# Patient Record
Sex: Male | Born: 1975 | Race: White | Hispanic: No | Marital: Married | Smoking: Never smoker
Health system: Southern US, Community
[De-identification: ages and names within clinical notes are randomized; demographics above are authoritative.]

## PROBLEM LIST (undated history)

## (undated) DIAGNOSIS — I10 Essential (primary) hypertension: Secondary | ICD-10-CM

## (undated) DIAGNOSIS — G459 Transient cerebral ischemic attack, unspecified: Secondary | ICD-10-CM

## (undated) DIAGNOSIS — N200 Calculus of kidney: Secondary | ICD-10-CM

## (undated) HISTORY — DX: Calculus of kidney: N20.0

## (undated) HISTORY — PX: VASECTOMY: SHX75

## (undated) HISTORY — PX: LITHOTRIPSY: SUR834

## (undated) HISTORY — PX: WISDOM TOOTH EXTRACTION: SHX21

---

## 2007-06-22 ENCOUNTER — Emergency Department (HOSPITAL_COMMUNITY): Admission: EM | Admit: 2007-06-22 | Discharge: 2007-06-22 | Payer: Self-pay | Admitting: Emergency Medicine

## 2012-05-17 ENCOUNTER — Ambulatory Visit (INDEPENDENT_AMBULATORY_CARE_PROVIDER_SITE_OTHER): Payer: BC Managed Care – PPO | Admitting: Emergency Medicine

## 2012-05-17 ENCOUNTER — Other Ambulatory Visit: Payer: Self-pay | Admitting: Urology

## 2012-05-17 ENCOUNTER — Ambulatory Visit: Payer: BC Managed Care – PPO

## 2012-05-17 VITALS — BP 160/109 | HR 72 | Temp 97.5°F | Resp 16 | Ht 69.0 in | Wt 228.0 lb

## 2012-05-17 DIAGNOSIS — M545 Low back pain, unspecified: Secondary | ICD-10-CM

## 2012-05-17 DIAGNOSIS — R110 Nausea: Secondary | ICD-10-CM

## 2012-05-17 DIAGNOSIS — N200 Calculus of kidney: Secondary | ICD-10-CM

## 2012-05-17 LAB — POCT URINALYSIS DIPSTICK
Bilirubin, UA: NEGATIVE
Glucose, UA: NEGATIVE
Spec Grav, UA: 1.025
Urobilinogen, UA: 0.2
pH, UA: 6

## 2012-05-17 LAB — POCT UA - MICROSCOPIC ONLY
Bacteria, U Microscopic: NEGATIVE
Casts, Ur, LPF, POC: NEGATIVE
Epithelial cells, urine per micros: NEGATIVE
Mucus, UA: NEGATIVE

## 2012-05-17 MED ORDER — KETOROLAC TROMETHAMINE 60 MG/2ML IM SOLN
60.0000 mg | Freq: Once | INTRAMUSCULAR | Status: AC
  Administered 2012-05-17: 60 mg via INTRAMUSCULAR

## 2012-05-17 MED ORDER — ONDANSETRON 8 MG PO TBDP: 8.0000 mg | ORAL_TABLET | Freq: Three times a day (TID) | ORAL | Status: DC | PRN

## 2012-05-17 MED ORDER — ONDANSETRON 4 MG PO TBDP
4.0000 mg | ORAL_TABLET | Freq: Once | ORAL | Status: AC
  Administered 2012-05-17: 4 mg via ORAL

## 2012-05-17 NOTE — Patient Instructions (Addendum)
You go today to the Urologist / [facility] at 1:30.    Kidney Stones Kidney stones (ureteral lithiasis) are deposits that form inside your kidneys. The intense pain is caused by the stone moving through the urinary tract. When the stone moves, the ureter goes into spasm around the stone. The stone is usually passed in the urine.  CAUSES   A disorder that makes certain neck glands produce too much parathyroid hormone (primary hyperparathyroidism).  A buildup of uric acid crystals.  Narrowing (stricture) of the ureter.  A kidney obstruction present at birth (congenital obstruction).  Previous surgery on the kidney or ureters.  Numerous kidney infections. SYMPTOMS   Feeling sick to your stomach (nauseous).  Throwing up (vomiting).  Blood in the urine (hematuria).  Pain that usually spreads (radiates) to the groin.  Frequency or urgency of urination. DIAGNOSIS   Taking a history and physical exam.  Blood or urine tests.  Computerized X-ray scan (CT scan).  Occasionally, an examination of the inside of the urinary bladder (cystoscopy) is performed. TREATMENT   Observation.  Increasing your fluid intake.  Surgery may be needed if you have severe pain or persistent obstruction. The size, location, and chemical composition are all important variables that will determine the proper choice of action for you. Talk to your caregiver to better understand your situation so that you will minimize the risk of injury to yourself and your kidney.  HOME CARE INSTRUCTIONS   Drink enough water and fluids to keep your urine clear or pale yellow.  Strain all urine through the provided strainer. Keep all particulate matter and stones for your caregiver to see. The stone causing the pain may be as small as a grain of salt. It is very important to use the strainer each and every time you pass your urine. The collection of your stone will allow your caregiver to analyze it and verify  that a stone has actually passed.  Only take over-the-counter or prescription medicines for pain, discomfort, or fever as directed by your caregiver.  Make a follow-up appointment with your caregiver as directed.  Get follow-up X-rays if required. The absence of pain does not always mean that the stone has passed. It may have only stopped moving. If the urine remains completely obstructed, it can cause loss of kidney function or even complete destruction of the kidney. It is your responsibility to make sure X-rays and follow-ups are completed. Ultrasounds of the kidney can show blockages and the status of the kidney. Ultrasounds are not associated with any radiation and can be performed easily in a matter of minutes. SEEK IMMEDIATE MEDICAL CARE IF:   Pain cannot be controlled with the prescribed medicine.  You have a fever.  The severity or intensity of pain increases over 18 hours and is not relieved by pain medicine.  You develop a new onset of abdominal pain.  You feel faint or pass out. MAKE SURE YOU:   Understand these instructions.  Will watch your condition.  Will get help right away if you are not doing well or get worse. Document Released: 12/27/2004 Document Revised: 03/21/2011 Document Reviewed: 04/24/2009 [facility] Patient Information 2013 [place], [place].

## 2012-05-17 NOTE — Progress Notes (Signed)
  Subjective:    Patient ID: Wayne Edwards, male    DOB: 1975/07/31, 37 y.o.   MRN: [id number]  Back Pain This is a new problem. The current episode started in the past 7 days. The problem occurs 2 to 4 times per day. The problem is unchanged. The pain is present in the lumbar spine. The quality of the pain is described as shooting, stabbing and aching. The pain is at a severity of 8/10. The pain is severe. The pain is the same all the time. Associated symptoms include a fever.  Emesis  Associated symptoms include chills, diarrhea and a fever.   Patient comes in with right lower back pain that happen last Friday. He went out of town to [place] Last week at on the way back while riding on the bus his lower back started hurting him   Review of Systems  Constitutional: Positive for fever and chills.  Gastrointestinal: Positive for nausea, vomiting and diarrhea.  Genitourinary: Positive for testicular pain.  Musculoskeletal: Positive for back pain.       Objective:   Physical Exam HEENT exam is normal there is exquisite tenderness in the right flank. The abdomen is obese there are no masses felt there is mild right midabdominal tenderness. No results found for this or any previous visit. Results for orders placed in visit on 05/17/12  POCT UA - MICROSCOPIC ONLY      Result Value Range   WBC, Ur, HPF, POC neg     RBC, urine, microscopic TNTC     Bacteria, U Microscopic neg     Mucus, UA neg     Epithelial cells, urine per micros neg     Crystals, Ur, HPF, POC neg     Casts, Ur, LPF, POC neg     Yeast, UA neg    POCT URINALYSIS DIPSTICK      Result Value Range   Color, UA brown     Clarity, UA cloudy     Glucose, UA neg     Bilirubin, UA neg     Ketones, UA neg     Spec Grav, UA 1.025     Blood, UA large     pH, UA 6.0     Protein, UA 30     Urobilinogen, UA 0.2     Nitrite, UA neg     Leukocytes, UA Negative     [facility] reading (PRIMARY) by  Dr. Cleta Alberts is a 4 mm calcific  density adjacent to the transverse process of the L3 vertebrae        Assessment & Plan:  Patient with significant hematuria as well as evidence on KUB of the stone we'll contact the urologist regarding next step.

## 2012-05-18 ENCOUNTER — Encounter (HOSPITAL_COMMUNITY): Payer: Self-pay | Admitting: *Deleted

## 2012-05-18 ENCOUNTER — Encounter (HOSPITAL_COMMUNITY): Payer: Self-pay | Admitting: Pharmacy Technician

## 2012-05-18 NOTE — Progress Notes (Signed)
PA approved for zofran ODT 8 mg through 05/18/13. Faxed pharmacy.

## 2012-05-18 NOTE — Progress Notes (Signed)
Pre Litho Phone call Spoke to patient via phone,history obtained,updated.  Bring blue folder,insurance cards,picture ID,designated driver and living will,POA, if desires (to be placed on chart). Reinforced no aspirin(instructions to hold aspirin per your doctor), ibuprofen products 72 hours prior to procedure Pt stated he has toradol 05/17/12 but knows not to take any of the above 72 hrs prior to procedureNo vitamins or herbal medicines 7 days prior to procedure.   Follow laxative instructions provided by urologist (office) and in blue folder. Wear easy on/off clothing and no jewelry except wedding rings and ear rings. Leave all other valuables at home. Verbalizes understanding of instructions

## 2012-05-21 ENCOUNTER — Encounter (HOSPITAL_COMMUNITY): Payer: Self-pay

## 2012-05-21 ENCOUNTER — Encounter (HOSPITAL_COMMUNITY)
Admission: RE | Disposition: A | Discharge status: Home / Self Care | Payer: Self-pay | Source: Ambulatory Visit | Attending: Urology

## 2012-05-21 ENCOUNTER — Ambulatory Visit (HOSPITAL_COMMUNITY): Payer: BC Managed Care – PPO

## 2012-05-21 ENCOUNTER — Ambulatory Visit (HOSPITAL_COMMUNITY)
Admission: RE | Admit: 2012-05-21 | Discharge: 2012-05-21 | Disposition: A | Discharge status: Home / Self Care | Payer: BC Managed Care – PPO | Source: Ambulatory Visit | Attending: Urology | Admitting: Urology

## 2012-05-21 DIAGNOSIS — K219 Gastro-esophageal reflux disease without esophagitis: Secondary | ICD-10-CM | POA: Insufficient documentation

## 2012-05-21 DIAGNOSIS — N201 Calculus of ureter: Secondary | ICD-10-CM | POA: Insufficient documentation

## 2012-05-21 SURGERY — LITHOTRIPSY, ESWL
Anesthesia: LOCAL

## 2012-05-21 MED ORDER — DIPHENHYDRAMINE HCL 25 MG PO CAPS
25.0000 mg | ORAL_CAPSULE | ORAL | Status: AC
  Administered 2012-05-21: 25 mg via ORAL
  Filled 2012-05-21: qty 1

## 2012-05-21 MED ORDER — SODIUM CHLORIDE 0.9 % IV SOLN
INTRAVENOUS | Status: DC
  Administered 2012-05-21: 15:00:00 via INTRAVENOUS

## 2012-05-21 MED ORDER — CIPROFLOXACIN HCL 500 MG PO TABS
500.0000 mg | ORAL_TABLET | ORAL | Status: AC
  Administered 2012-05-21: 500 mg via ORAL
  Filled 2012-05-21: qty 1

## 2012-05-21 MED ORDER — OXYCODONE-ACETAMINOPHEN 5-325 MG PO TABS
ORAL_TABLET | ORAL | Status: AC
  Administered 2012-05-21: 2
  Filled 2012-05-21: qty 2

## 2012-05-21 MED ORDER — OXYCODONE-ACETAMINOPHEN 5-325 MG PO TABS: 1.0000 | ORAL_TABLET | Freq: Once | ORAL | Status: DC

## 2012-05-21 MED ORDER — CIPROFLOXACIN HCL 500 MG PO TABS: 500.0000 mg | ORAL_TABLET | Freq: Two times a day (BID) | ORAL | Status: DC

## 2012-05-21 MED ORDER — OXYCODONE-ACETAMINOPHEN 5-325 MG PO TABS: 1.0000 | ORAL_TABLET | ORAL | Status: DC | PRN

## 2012-05-21 MED ORDER — DIAZEPAM 5 MG PO TABS
10.0000 mg | ORAL_TABLET | ORAL | Status: AC
  Administered 2012-05-21: 10 mg via ORAL
  Filled 2012-05-21: qty 2

## 2012-05-21 NOTE — H&P (Signed)
History of Present Illness         This is a new patient referred for possible right ureteral stone by Dr. Cleta Alberts - [facility]. He developed some right flank pain about a week ago. He then developed diarrhea and emesis. Yesterday he got nausea and pain radiating down into right testicle. Pain intermittent. Earlier this morning in was severe right flank. He went to UC. He was given a shot of toradol and zofran.    He has no gross hematuria or dysuria.   May 17, 2012 KUB showed a possible 4 mm stone over right L3 transverse process. I reviewed the imaging compared it to the CT in 2011 which showed the stone in the right kidney as well as a right UVJ stone he was passing at the time.  Urinalysis May 8 showed too numerous to count red cells but no other abnormalities.   They are also interested in vasectomy. Their kids are 6 mo and 80 yo. Curently using "abstinence". Theyve considered vasectomy for several years. They lost a baby prior to the 16 mo old and had discussed.   Right renal US - mild right hydro and dilation of right ureter. PVR normal.    Past Medical History Problems  1. History of  Esophageal Reflux 530.81  Surgical History Problems  1. History of  Dental Surgery  Current Meds 1. No Reported Medications  Allergies Medication  1. No Known Drug Allergies  Family History Problems  1. Family history of  Family Health Status - Father's Age 65y/o 2. Family history of  Family Health Status - Mother's Age 62y/o 3. Family history of  Family Health Status Number Of Children 1 son  1 daughter 4. Paternal history of  Nephrolithiasis  Social History Problems    Being A Social Drinker   Caffeine Use 2-3 a day   Marital History - Currently Married   Never A Smoker   Occupation: Runner, broadcasting/film/video Denied    History of  Tobacco Use  Review of Systems Genitourinary, constitutional, skin, eye, otolaryngeal, hematologic/lymphatic, cardiovascular, pulmonary, endocrine, musculoskeletal,  gastrointestinal, neurological and psychiatric system(s) were reviewed and pertinent findings if present are noted.    Vitals Vital Signs [Data Includes: Last 1 Day]  08May2014 01:54PM  Blood Pressure: 106 / 69 Temperature: 97.4 F Heart Rate: 73 08May2014 01:51PM  BMI Calculated: 32.64 BSA Calculated: 2.21 Height: 5 ft 10 in Weight: 228 lb   Physical Exam Constitutional: Well nourished and well developed . No acute distress.  ENT:. The ears and nose are normal in appearance.  Neck: The appearance of the neck is normal and no neck mass is present.  Pulmonary: No respiratory distress and normal respiratory rhythm and effort.  Cardiovascular: Heart rate and rhythm are normal . No peripheral edema.  Abdomen: The abdomen is soft and nontender. No masses are palpated. No CVA tenderness. No hernias are palpable. No hepatosplenomegaly noted.  Lymphatics: The femoral and inguinal nodes are not enlarged or tender.  Skin: Normal skin turgor, no visible rash and no visible skin lesions.  Neuro/Psych:. Mood and affect are appropriate.    Results/Data Urine [Data Includes: Last 1 Day]   08May2014  COLOR YELLOW   APPEARANCE CLOUDY   SPECIFIC GRAVITY 1.015   pH 7.0   GLUCOSE NEG mg/dL  BILIRUBIN NEG   KETONE NEG mg/dL  BLOOD LARGE   PROTEIN NEG mg/dL  UROBILINOGEN 0.2 mg/dL  NITRITE NEG   LEUKOCYTE ESTERASE NEG   SQUAMOUS EPITHELIAL/HPF NONE SEEN  WBC NONE SEEN WBC/hpf  RBC 21-50 RBC/hpf  BACTERIA NONE SEEN   CRYSTALS NONE SEEN   CASTS NONE SEEN    Assessment Assessed  1. Ureteral Stone 592.1  Plan Health Maintenance (V70.0)  1. UA With REFLEX  Done: 08May2014 01:41PM Ureteral Stone (592.1)  2. Hydrocodone-Acetaminophen 5-325 MG Oral Tablet; TAKE 1 TO 2 TABLETS EVERY 4 TO 6  HOURS AS NEEDED FOR PAIN; Therapy: 08May2014 to (Evaluate:07Jun2014); Last  Rx:08May2014 3. Promethazine HCl 12.5 MG Oral Tablet; TAKE 1 TABLET EVERY 4 TO 6 HOURS AS NEEDED  FOR NAUSEA; Therapy:  08May2014 to (Evaluate:22May2014)  Requested for: 08May2014; Last  Rx:08May2014; Edited 4. Tamsulosin HCl 0.4 MG Oral Capsule; TAKE 1 CAPSULE Daily; Therapy: 08May2014 to  (Evaluate:07Jun2014)  Requested for: 08May2014; Last Rx:08May2014; Edited 5. Follow-up Schedule Surgery Office  Follow-up  Requested for: 08May2014 6. RENAL U/S RIGHT  Done: 08May2014 12:00AM  Discussion/Summary       I discussed with the patient and his wife the nature risks benefits of continued stone passage with off label alpha-blocker use, endoscopic management and the role of ureteral stenting, extracorporeal shockwave lithotripsy. He wants to limit his absence from work and does not warrant invasive therapy. He is interested in shockwave but would like to try to pass the stone for a few days.  Renal ultrasound today shows hydro.   We will consider vasectomy further in the future.    Signatures Electronically signed by : Jerilee Field, M.D.; May 17 2012  3:31PM

## 2012-05-21 NOTE — Progress Notes (Signed)
Post lithotripsy recovery in short stay. Returns via w/c and right flank has softball sized area of pink

## 2012-07-10 ENCOUNTER — Ambulatory Visit: Payer: BC Managed Care – PPO

## 2013-07-25 ENCOUNTER — Ambulatory Visit (INDEPENDENT_AMBULATORY_CARE_PROVIDER_SITE_OTHER): Payer: BC Managed Care – PPO | Admitting: Family Medicine

## 2013-07-25 VITALS — BP 124/74 | HR 77 | Temp 98.0°F | Resp 16 | Ht 69.0 in | Wt 172.0 lb

## 2013-07-25 DIAGNOSIS — H60392 Other infective otitis externa, left ear: Secondary | ICD-10-CM

## 2013-07-25 DIAGNOSIS — H60399 Other infective otitis externa, unspecified ear: Secondary | ICD-10-CM

## 2013-07-25 MED ORDER — OFLOXACIN 0.3 % OT SOLN: 10.0000 [drp] | Freq: Every day | OTIC | Status: DC

## 2013-07-25 MED ORDER — OFLOXACIN 0.3 % OT SOLN: 5.0000 [drp] | Freq: Every day | OTIC | Status: DC

## 2013-07-25 NOTE — Progress Notes (Signed)
   Subjective:    Patient ID: Wayne Edwards, male    DOB: 09-Mar-1975, 38 y.o.   MRN: [id number]  HPI Patient presents today with 3-4 days of left ear pain and drainage (yellow-clear). Took a left over hydrocodone which helped his pain. Took ibuprofen which did not provide much relief. Applied ear drying drops containing alcohol which was very painful. [place] days [place] to onset of pain[place] [employer] [place]  Son with cold last week.  Review of Systems Subjective fever 4-5 days ago, sore throat off and on, no runny nose, mild headache, occasional cough- nonproductive.    Objective:   Physical Exam  Vitals reviewed. Constitutional: He is oriented to person, place, and time. He appears well-developed and well-nourished.  HENT:  Head: Normocephalic and atraumatic.  Right Ear: Tympanic membrane, external ear and ear canal normal.  Left Ear: There is drainage, swelling and tenderness. No mastoid tenderness. Tympanic membrane is erythematous.  Eyes: Conjunctivae are normal.  Neck: Normal range of motion. Neck supple.  Cardiovascular: Normal rate, regular rhythm and normal heart sounds.   Pulmonary/Chest: Effort normal and breath sounds normal.  Musculoskeletal: Normal range of motion.  Neurological: He is alert and oriented to person, place, and time.  Skin: Skin is warm and dry.  Psychiatric: He has a normal mood and affect. His behavior is normal. Judgment and thought content normal.      Assessment & Plan:  1. Otitis, externa, infective, left - ofloxacin (FLOXIN) 0.3 % otic solution; Place 10 drops into the left ear daily. For 7 days  Dispense: 10 mL; Refill: 0 -RTC if no improvement in symptoms in 3-5 days or if worsening pain -ibuprofen/acetaminophen prn  Emi Belfasteborah B. Maie Kesinger, FNP-BC  [facility], [facility]  07/25/2013 9:30 PM

## 2013-07-25 NOTE — Patient Instructions (Signed)
Otitis Externa Otitis externa is a bacterial or fungal infection of the outer ear canal. This is the area from the eardrum to the outside of the ear. Otitis externa is sometimes called "swimmer's ear." CAUSES  Possible causes of infection include:  Swimming in dirty water.  Moisture remaining in the ear after swimming or bathing.  Mild injury (trauma) to the ear.  Objects stuck in the ear (foreign body).  Cuts or scrapes (abrasions) on the outside of the ear. SYMPTOMS  The first symptom of infection is often itching in the ear canal. Later signs and symptoms may include swelling and redness of the ear canal, ear pain, and yellowish-white fluid (pus) coming from the ear. The ear pain may be worse when pulling on the earlobe. DIAGNOSIS  Your caregiver will perform a physical exam. A sample of fluid may be taken from the ear and examined for bacteria or fungi. TREATMENT  Antibiotic ear drops are often given for 10 to 14 days. Treatment may also include pain medicine or corticosteroids to reduce itching and swelling. PREVENTION   Keep your ear dry. Use the corner of a towel to absorb water out of the ear canal after swimming or bathing.  Avoid scratching or putting objects inside your ear. This can damage the ear canal or remove the protective wax that lines the canal. This makes it easier for bacteria and fungi to grow.  Avoid swimming in lakes, polluted water, or poorly chlorinated pools.  You may use ear drops made of rubbing alcohol and vinegar after swimming. Combine equal parts of white vinegar and alcohol in a bottle. Put 3 or 4 drops into each ear after swimming. HOME CARE INSTRUCTIONS   Apply antibiotic ear drops to the ear canal as prescribed by your caregiver.  Only take over-the-counter or prescription medicines for pain, discomfort, or fever as directed by your caregiver.  If you have diabetes, follow any additional treatment instructions from your caregiver.  Keep all  follow-up appointments as directed by your caregiver. SEEK MEDICAL CARE IF:   You have a fever.  Your ear is still red, swollen, painful, or draining pus after 3 days.  Your redness, swelling, or pain gets worse.  You have a severe headache.  You have redness, swelling, pain, or tenderness in the area behind your ear. MAKE SURE YOU:   Understand these instructions.  Will watch your condition.  Will get help right away if you are not doing well or get worse. Document Released: 12/27/2004 Document Revised: 03/21/2011 Document Reviewed: 01/13/2011 [employer] Patient Information 2015 [employer]. This information is not intended to replace advice given to you by your health care provider. Make sure you discuss any questions you have with your health care provider.  

## 2013-07-26 ENCOUNTER — Ambulatory Visit: Payer: BC Managed Care – PPO | Admitting: Family Medicine

## 2013-08-05 ENCOUNTER — Telehealth: Payer: Self-pay

## 2013-08-05 NOTE — Telephone Encounter (Signed)
Pt would like to talk with someone about getting a referral to an ent office   Best number [phone]

## 2013-08-06 NOTE — Telephone Encounter (Signed)
lmom for pt to cb

## 2013-08-07 NOTE — Telephone Encounter (Signed)
Spoke to pt: Would like a referral to ENT, eardrops for ear drainage, hearing loss, infection.  Ear not 100% it is slowly improving, but still having drainage.  Suggested pt RTC for eval since he is still having issues.  Transferred to billing to schedule appt.

## 2013-08-08 ENCOUNTER — Ambulatory Visit (INDEPENDENT_AMBULATORY_CARE_PROVIDER_SITE_OTHER): Payer: BC Managed Care – PPO | Admitting: Family Medicine

## 2013-08-08 VITALS — BP 110/74 | HR 75 | Temp 98.9°F | Resp 18 | Ht 69.5 in | Wt 173.0 lb

## 2013-08-08 DIAGNOSIS — H66009 Acute suppurative otitis media without spontaneous rupture of ear drum, unspecified ear: Secondary | ICD-10-CM

## 2013-08-08 DIAGNOSIS — H66002 Acute suppurative otitis media without spontaneous rupture of ear drum, left ear: Secondary | ICD-10-CM

## 2013-08-08 MED ORDER — AMOXICILLIN-POT CLAVULANATE 875-125 MG PO TABS: 1.0000 | ORAL_TABLET | Freq: Two times a day (BID) | ORAL | Status: DC

## 2013-08-08 NOTE — Progress Notes (Signed)
Subjective:  This chart was scribed for Wayne SorensonEva Kialee Kham, MD by Carl Bestelina Holson, Medical Scribe. This patient was seen in Room 13 and the patient's care was started at 12:07 PM.   Patient ID: Wayne Edwards, male    DOB: March 26, 1975, 38 y.o.   MRN: [id number] Chief Complaint  Patient presents with  . Follow-up    recheck left ear     HPI HPI Comments: Wayne Nasutidam Wolgamott is a 38 y.o. male who presents to the [facility] for a follow-up of otitis externa in his left ear.  He is not experiencing any pain and the drainage from his left ear has decreased.  He feels as though something is still in his ear and there has been some slight drainage.  He states that he used the drops as prescribed.  He denies sinus pressure, fever, and chills as associated symptoms.  He does endorse some hearing loss.  He has not had a history of hearing loss or ear problems.  He does not have any allergies to antibiotics.  He did experience ear infections as a child.    He was seen two weeks ago and diagnosed with otitis externa.  He was placed on Floxin drops.  Past Medical History  Diagnosis Date  . Kidney stones     age 38  and 2011   Past Surgical History  Procedure Laterality Date  . Wisdom tooth extraction    . Vasectomy    . Lithotripsy     History reviewed. No pertinent family history. History   Social History  . Marital Status: Married    Spouse Name: N/A    Number of Children: N/A  . Years of Education: N/A   Occupational History  . Not on file.   Social History Main Topics  . Smoking status: Never Smoker   . Smokeless tobacco: Never Used  . Alcohol Use: Yes     Comment: rarely  . Drug Use: No  . Sexual Activity: Yes   Other Topics Concern  . Not on file   Social History Narrative  . No narrative on file   Allergies  Allergen Reactions  . Yellow Jacket Venom [Bee Venom] Anaphylaxis    Review of Systems  Constitutional: Negative for fever, chills and diaphoresis.  HENT:  Positive for ear discharge and hearing loss. Negative for ear pain and sinus pressure.   Neurological: Negative for headaches.      BP 110/74  Pulse 75  Temp(Src) 98.9 F (37.2 C) (Oral)  Resp 18  Ht 5' 9.5" (1.765 m)  Wt 173 lb (78.472 kg)  BMI 25.19 kg/m2  SpO2 99%  Objective:  Physical Exam  Nursing note and vitals reviewed. Constitutional: He is oriented to person, place, and time. He appears well-developed and well-nourished.  HENT:  Head: Normocephalic and atraumatic.  Right Ear: A middle ear effusion is present.  Left Ear: Ear canal normal. Tympanic membrane is injected, erythematous and retracted. A middle ear effusion is present.  Loss of hearing in left ear to soft sound.  Eyes: EOM are normal.  Neck: Normal range of motion.  Cardiovascular: Normal rate.   Pulmonary/Chest: Effort normal.  Musculoskeletal: Normal range of motion.  Neurological: He is alert and oriented to person, place, and time.  Skin: Skin is warm and dry.  Psychiatric: He has a normal mood and affect. His behavior is normal.     Assessment & Plan:   Will prescribe the patient antibiotics.  Advised the  patient to monitor his symptoms for the next two weeks and if they do not resolve, will refer patient to an ENT specialist. Acute suppurative otitis media of left ear without spontaneous rupture of tympanic membrane, recurrence not specified  Meds ordered this encounter  Medications  . amoxicillin-clavulanate (AUGMENTIN) 875-125 MG per tablet    Sig: Take 1 tablet by mouth 2 (two) times daily.    Dispense:  28 tablet    Refill:  0    I personally performed the services described in this documentation, which was scribed in my presence. The recorded information has been reviewed and considered, and addended by me as needed.  Wayne Sorenson, MD MPH

## 2013-08-08 NOTE — Patient Instructions (Signed)
Otitis Media With Effusion Otitis media with effusion is the presence of fluid in the middle ear. This is a common problem in children, which often follows ear infections. It may be present for weeks or longer after the infection. Unlike an acute ear infection, otitis media with effusion refers only to fluid behind the ear drum and not infection. Children with repeated ear and sinus infections and allergy problems are the most likely to get otitis media with effusion. CAUSES  The most frequent cause of the fluid buildup is dysfunction of the eustachian tubes. These are the tubes that drain fluid in the ears to the back of the nose (nasopharynx). SYMPTOMS   The main symptom of this condition is hearing loss. As a result, you or your child may:  Listen to the TV at a loud volume.  Not respond to questions.  Ask "what" often when spoken to.  Mistake or confuse one sound or word for another.  There may be a sensation of fullness or pressure but usually not pain. DIAGNOSIS   Your health care provider will diagnose this condition by examining you or your child's ears.  Your health care provider may test the pressure in you or your child's ear with a tympanometer.  A hearing test may be conducted if the problem persists. TREATMENT   Treatment depends on the duration and the effects of the effusion.  Antibiotics, decongestants, nose drops, and cortisone-type drugs (tablets or nasal spray) may not be helpful.  Children with persistent ear effusions may have delayed language or behavioral problems. Children at risk for developmental delays in hearing, learning, and speech may require referral to a specialist earlier than children not at risk.  You or your child's health care provider may suggest a referral to an ear, nose, and throat surgeon for treatment. The following may help restore normal hearing:  Drainage of fluid.  Placement of ear tubes (tympanostomy tubes).  Removal of adenoids  (adenoidectomy). HOME CARE INSTRUCTIONS   Avoid secondhand smoke.  Infants who are breastfed are less likely to have this condition.  Avoid feeding infants while they are lying flat.  Avoid known environmental allergens.  Avoid people who are sick. SEEK MEDICAL CARE IF:   Hearing is not better in 3 months.  Hearing is worse.  Ear pain.  Drainage from the ear.  Dizziness. MAKE SURE YOU:   Understand these instructions.  Will watch your condition.  Will get help right away if you are not doing well or get worse. Document Released: 02/04/2004 Document Revised: 05/13/2013 Document Reviewed: 07/24/2012 [facility] Patient Information 2015 [place], [place]. This information is not intended to replace advice given to you by your health care provider. Make sure you discuss any questions you have with your health care provider.  Otitis Media Otitis media is redness, soreness, and inflammation of the middle ear. Otitis media may be caused by allergies or, most commonly, by infection. Often it occurs as a complication of the common cold. SIGNS AND SYMPTOMS Symptoms of otitis media may include:  Earache.  Fever.  Ringing in your ear.  Headache.  Leakage of fluid from the ear. DIAGNOSIS To diagnose otitis media, your health care provider will examine your ear with an otoscope. This is an instrument that allows your health care provider to see into your ear in order to examine your eardrum. Your health care provider also will ask you questions about your symptoms. TREATMENT  Typically, otitis media resolves on its own within 3-5 days. Your  health care provider may prescribe medicine to ease your symptoms of pain. If otitis media does not resolve within 5 days or is recurrent, your health care provider may prescribe antibiotic medicines if he or she suspects that a bacterial infection is the cause. °HOME CARE INSTRUCTIONS  °· If you were prescribed an antibiotic medicine, finish it all  even if you start to feel better. °· Take medicines only as directed by your health care provider. °· Keep all follow-up visits as directed by your health care provider. °SEEK MEDICAL CARE IF: °· You have otitis media only in one ear, or bleeding from your nose, or both. °· You notice a lump on your neck. °· You are not getting better in 3-5 days. °· You feel worse instead of better. °SEEK IMMEDIATE MEDICAL CARE IF:  °· You have pain that is not controlled with medicine. °· You have swelling, redness, or pain around your ear or stiffness in your neck. °· You notice that part of your face is paralyzed. °· You notice that the bone behind your ear (mastoid) is tender when you touch it. °MAKE SURE YOU:  °· Understand these instructions. °· Will watch your condition. °· Will get help right away if you are not doing well or get worse. °Document Released: 10/02/2003 Document Revised: 05/13/2013 Document Reviewed: 07/24/2012 °ExitCare® Patient Information ©2015 [employer] LLC. This information is not intended to replace advice given to you by your health care provider. Make sure you discuss any questions you have with your health care provider. ° °

## 2014-04-22 IMAGING — CR DG ABDOMEN 1V
1 series · 1 of 1 positions shown · non-contrast
Comparison: 05/17/2012.

CLINICAL DATA: Lithotripsy.  Right-sided stone.

ABDOMEN - 1 VIEW

[t abdomen supine]
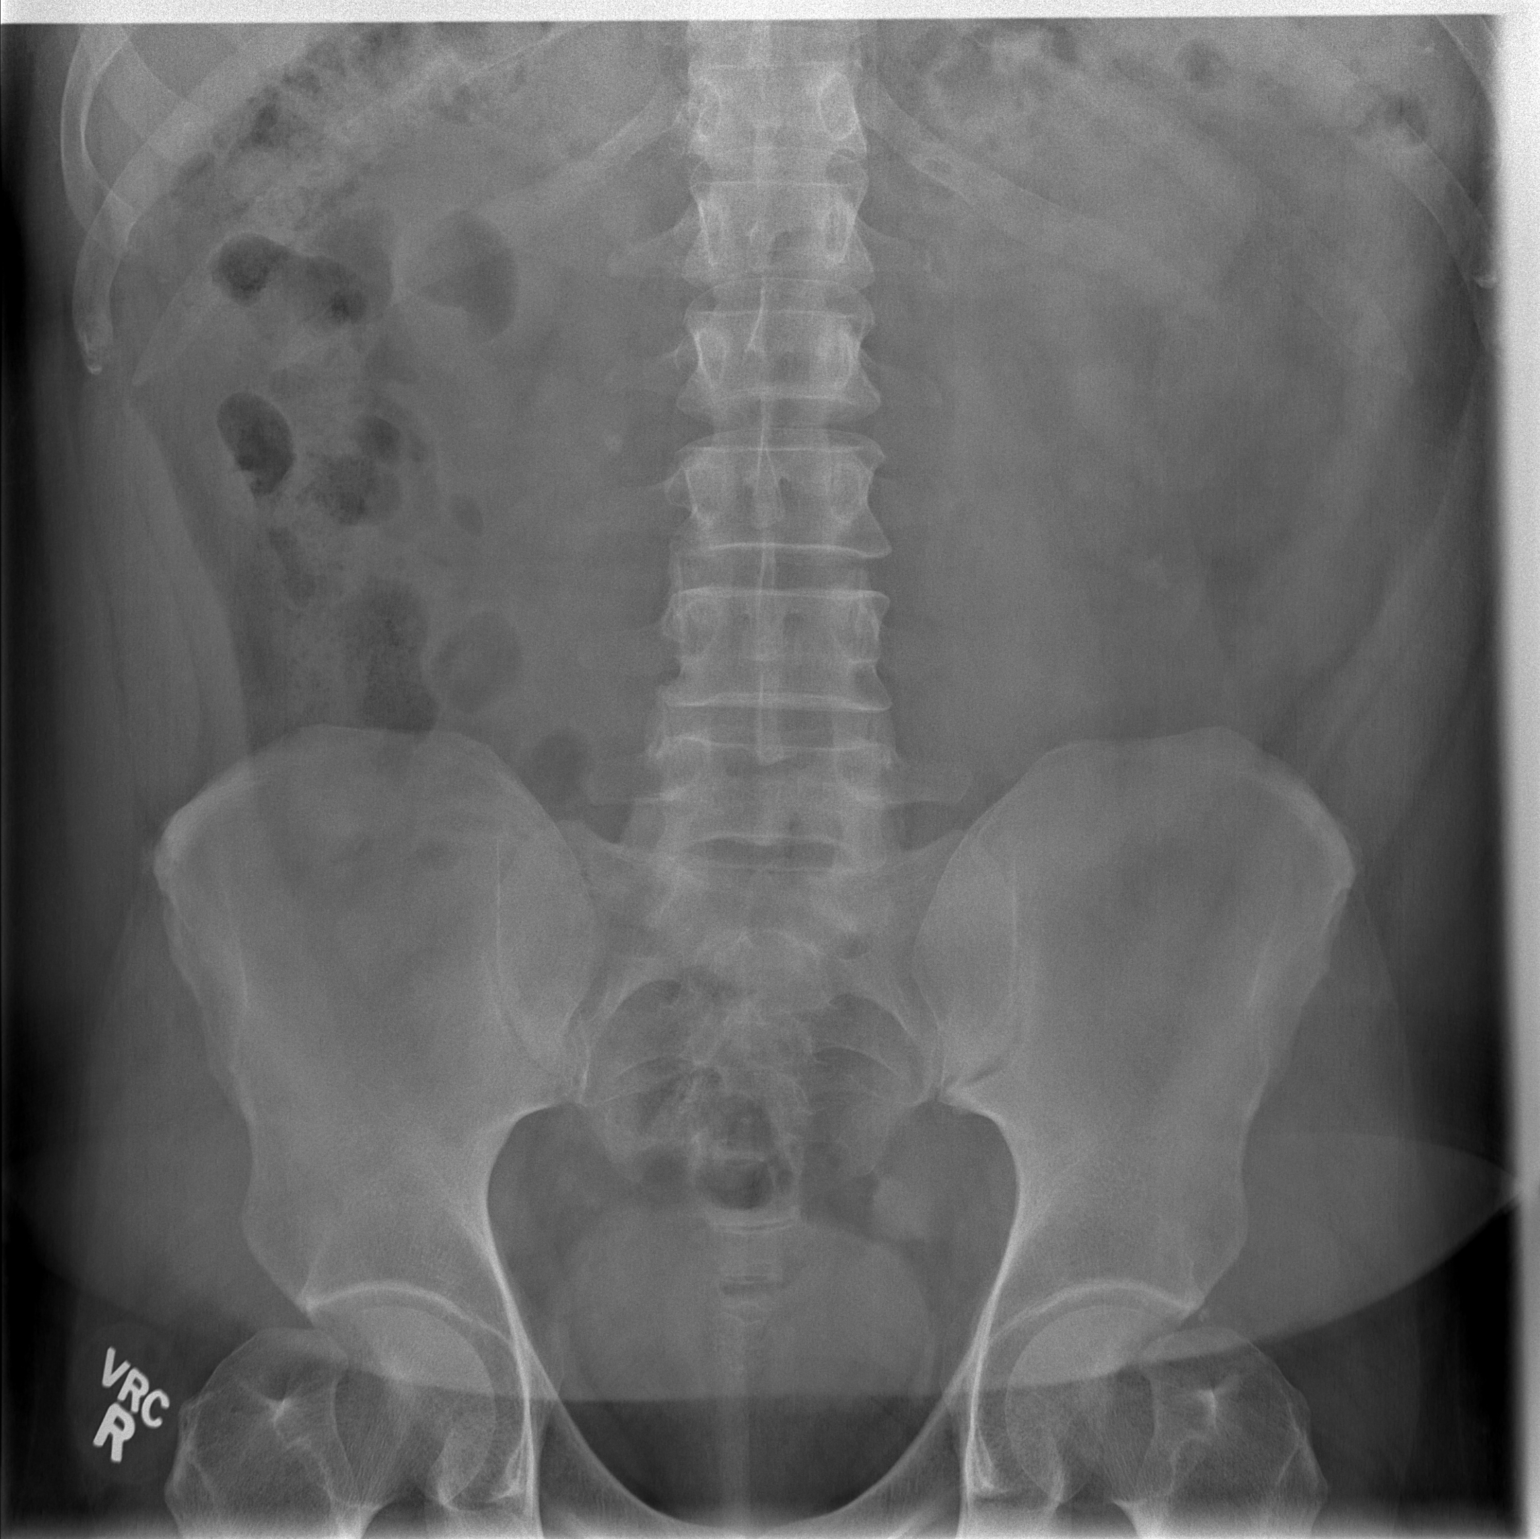

[1 of 1 positions shown; findings below may reference images not displayed]

FINDINGS: 5 mm stone overlying the medial right abdomen persists
without substantial interval change in the appearance or position.
No other unexpected abdominopelvic calcification.  Visualized bony
structures are unremarkable.
IMPRESSION: Persistence of the 5 mm stone in the medial right abdomen.

## 2017-05-04 ENCOUNTER — Encounter: Payer: Self-pay | Admitting: Family Medicine

## 2017-05-04 ENCOUNTER — Ambulatory Visit (INDEPENDENT_AMBULATORY_CARE_PROVIDER_SITE_OTHER): Payer: BC Managed Care – PPO

## 2017-05-04 ENCOUNTER — Ambulatory Visit: Payer: BC Managed Care – PPO | Admitting: Family Medicine

## 2017-05-04 ENCOUNTER — Other Ambulatory Visit: Payer: Self-pay

## 2017-05-04 VITALS — BP 102/84 | HR 85 | Temp 97.6°F | Ht 69.5 in | Wt 204.0 lb

## 2017-05-04 DIAGNOSIS — M79672 Pain in left foot: Secondary | ICD-10-CM

## 2017-05-04 DIAGNOSIS — Z23 Encounter for immunization: Secondary | ICD-10-CM

## 2017-05-04 DIAGNOSIS — Z125 Encounter for screening for malignant neoplasm of prostate: Secondary | ICD-10-CM | POA: Diagnosis not present

## 2017-05-04 DIAGNOSIS — Z0000 Encounter for general adult medical examination without abnormal findings: Secondary | ICD-10-CM | POA: Diagnosis not present

## 2017-05-04 DIAGNOSIS — Z13220 Encounter for screening for lipoid disorders: Secondary | ICD-10-CM | POA: Diagnosis not present

## 2017-05-04 DIAGNOSIS — Z131 Encounter for screening for diabetes mellitus: Secondary | ICD-10-CM

## 2017-05-04 NOTE — Progress Notes (Signed)
4/25/20199:35 AM  Wayne Edwards 04-05-1975, 42 y.o. male [id number]  Chief Complaint  Patient presents with  . Establish Care    having some aches and pains    HPI:   Patient is a 42 y.o. male who presents today for CPE  Has not seen a doctor in years Married, had HIV testing prior to getting married, declines at this time Last tetanus > 10 years He had lost over 75 lbs, mostly with calorie counting and exercise, has regained about 25 lbs back as he has become a bit more lax. He has been stable at this weight for about a year He wears glasses, sees optho yearly Sees dentist regularly, currently in process of getting root canal Has 2 kids, 11yo and 5yo He is a 2nd grade teacher He is having left heel pain, chronic, worsening, no trauma Otherwise feeling well Does not smoke, use illicit drugs. Drinks etoh socially He denies fhx CAD, DM2, prostate or colon cancer He reports grandmother with CHF and recurrent strokes  Depression screen [facility] 2/9 05/04/2017  Decreased Interest 0  Down, Depressed, Hopeless 0  PHQ - 2 Score 0    Allergies  Allergen Reactions  . Yellow Jacket Venom [Bee Venom] Anaphylaxis    Prior to Admission medications   Not on File    Past Medical History:  Diagnosis Date  . Kidney stones     Past Surgical History:  Procedure Laterality Date  . LITHOTRIPSY    . VASECTOMY    . WISDOM TOOTH EXTRACTION      Social History   Tobacco Use  . Smoking status: Never Smoker  . Smokeless tobacco: Never Used  Substance Use Topics  . Alcohol use: Yes    Comment: rarely    Family History  Problem Relation Age of Onset  . Healthy Mother   . Healthy Father   . Healthy Sister   . Healthy Daughter   . Healthy Son   . Hyperlipidemia Maternal Grandmother   . Healthy Maternal Grandfather   . Stroke Paternal Grandmother   . Healthy Paternal Grandfather     Review of Systems  Constitutional: Negative for chills, fever and malaise/fatigue.  HENT:  Negative for congestion, ear pain, hearing loss and sore throat.   Eyes: Negative for blurred vision and double vision.  Respiratory: Negative for cough and shortness of breath.   Cardiovascular: Negative for chest pain, palpitations and leg swelling.  Gastrointestinal: Negative for abdominal pain, blood in stool, constipation, diarrhea, melena, nausea and vomiting.  Genitourinary: Negative for dysuria, frequency and hematuria.       Neg ED  Musculoskeletal: Negative for back pain, falls and myalgias.       Pos left heel pain  Neurological: Negative for dizziness, sensory change, speech change, focal weakness and headaches.  Endo/Heme/Allergies: Negative for environmental allergies and polydipsia.  Psychiatric/Behavioral: Negative for depression. The patient is not nervous/anxious.      OBJECTIVE:  Blood pressure 102/84, pulse 85, temperature 97.6 F (36.4 C), temperature source Oral, height 5' 9.5" (1.765 m), weight 204 lb (92.5 kg), SpO2 98 %.   Physical Exam  Constitutional: He is oriented to person, place, and time. He appears well-developed and well-nourished.  HENT:  Head: Normocephalic and atraumatic.  Right Ear: Hearing, tympanic membrane, external ear and ear canal normal.  Left Ear: Hearing, tympanic membrane, external ear and ear canal normal.  Mouth/Throat: Oropharynx is clear and moist. No oropharyngeal exudate.  Eyes: Pupils are equal, round, and reactive to  light. Conjunctivae and EOM are normal.  Neck: Neck supple. No thyromegaly present.  Cardiovascular: Normal rate, regular rhythm, normal heart sounds and intact distal pulses. Exam reveals no gallop and no friction rub.  No murmur heard. Pulmonary/Chest: Effort normal and breath sounds normal. He has no wheezes. He has no rales.  Abdominal: Soft. Bowel sounds are normal. He exhibits no distension and no mass. There is no tenderness.  Musculoskeletal: Normal range of motion. He exhibits no edema.  Lymphadenopathy:      He has no cervical adenopathy.  Neurological: He is alert and oriented to person, place, and time. He has normal reflexes. No cranial nerve deficit. Gait normal.  Skin: Skin is warm and dry.  Psychiatric: He has a normal mood and affect.  Nursing note and vitals reviewed.   Dg Foot 2 Views Left  Result Date: 05/04/2017 CLINICAL DATA:  Worsening of left heel pain, no trauma EXAM: LEFT FOOT - 2 VIEW COMPARISON:  None. FINDINGS: Tarsal-metatarsal alignment is normal. Joint spaces appear normal. No fracture or stress reaction is seen. The calcaneus is unremarkable on the lateral view. No calcaneal spurring is seen. IMPRESSION: Negative. Electronically Signed   By: Dwyane DeePaul  Barry M.D.   On: 05/04/2017 10:23     ASSESSMENT and PLAN  1. Annual physical exam No concerns per history or exam. Routine HCM labs ordered. HCM reviewed/discussed. Anticipatory guidance regarding healthy weight, lifestyle and choices given.   2. Screening for lipid disorders - Lipid panel  3. Screening for diabetes mellitus - Hemoglobin A1c  4. Screening for prostate cancer - PSA  5. Need for vaccination  6. Pain of left heel - DG Foot 2 Views Left; Future  Other orders - Td vaccine greater than or equal to 7yo preservative free IM  Return in about 1 year (around 05/05/2018).    Myles LippsIrma M Santiago, MD [facility] [address] [place], [place] [address] Ph.  [phone] Fax [phone]

## 2017-05-04 NOTE — Patient Instructions (Addendum)
   IF you received an x-ray today, you will receive an invoice from [facility]. Please contact [facility] at [phone] with questions or concerns regarding your invoice.   IF you received labwork today, you will receive an invoice from [facility]. Please contact [facility] at [phone] with questions or concerns regarding your invoice.   Our billing staff will not be able to assist you with questions regarding bills from these companies.  You will be contacted with the lab results as soon as they are available. The fastest way to get your results is to activate your My Chart account. Instructions are located on the last page of this paperwork. If you have not heard from us regarding the results in 2 weeks, please contact this office.     Preventive Care 40-64 Years, Male Preventive care refers to lifestyle choices and visits with your health care provider that can promote health and wellness. What does preventive care include?  A yearly physical exam. This is also called an annual well check.  Dental exams once or twice a year.  Routine eye exams. Ask your health care provider how often you should have your eyes checked.  Personal lifestyle choices, including: ? Daily care of your teeth and gums. ? Regular physical activity. ? Eating a healthy diet. ? Avoiding tobacco and drug use. ? Limiting alcohol use. ? Practicing safe sex. ? Taking low-dose aspirin every day starting at age 50. What happens during an annual well check? The services and screenings done by your health care provider during your annual well check will depend on your age, overall health, lifestyle risk factors, and family history of disease. Counseling Your health care provider may ask you questions about your:  Alcohol use.  Tobacco use.  Drug use.  Emotional well-being.  Home and relationship well-being.  Sexual activity.  Eating habits.  Work and work  environment.  Screening You may have the following tests or measurements:  Height, weight, and BMI.  Blood pressure.  Lipid and cholesterol levels. These may be checked every 5 years, or more frequently if you are over 50 years old.  Skin check.  Lung cancer screening. You may have this screening every year starting at age 55 if you have a 30-pack-year history of smoking and currently smoke or have quit within the past 15 years.  Fecal occult blood test (FOBT) of the stool. You may have this test every year starting at age 50.  Flexible sigmoidoscopy or colonoscopy. You may have a sigmoidoscopy every 5 years or a colonoscopy every 10 years starting at age 50.  Prostate cancer screening. Recommendations will vary depending on your family history and other risks.  Hepatitis C blood test.  Hepatitis B blood test.  Sexually transmitted disease (STD) testing.  Diabetes screening. This is done by checking your blood sugar (glucose) after you have not eaten for a while (fasting). You may have this done every 1-3 years.  Discuss your test results, treatment options, and if necessary, the need for more tests with your health care provider. Vaccines Your health care provider may recommend certain vaccines, such as:  Influenza vaccine. This is recommended every year.  Tetanus, diphtheria, and acellular pertussis (Tdap, Td) vaccine. You may need a Td booster every 10 years.  Varicella vaccine. You may need this if you have not been vaccinated.  Zoster vaccine. You may need this after age 60.  Measles, mumps, and rubella (MMR) vaccine. You may need at least one dose of   MMR if you were born in 1957 or later. You may also need a second dose.  Pneumococcal 13-valent conjugate (PCV13) vaccine. You may need this if you have certain conditions and have not been vaccinated.  Pneumococcal polysaccharide (PPSV23) vaccine. You may need one or two doses if you smoke cigarettes or if you have  certain conditions.  Meningococcal vaccine. You may need this if you have certain conditions.  Hepatitis A vaccine. You may need this if you have certain conditions or if you travel or work in places where you may be exposed to hepatitis A.  Hepatitis B vaccine. You may need this if you have certain conditions or if you travel or work in places where you may be exposed to hepatitis B.  Haemophilus influenzae type b (Hib) vaccine. You may need this if you have certain risk factors.  Talk to your health care provider about which screenings and vaccines you need and how often you need them. This information is not intended to replace advice given to you by your health care provider. Make sure you discuss any questions you have with your health care provider. Document Released: 01/23/2015 Document Revised: 09/16/2015 Document Reviewed: 10/28/2014 Elsevier Interactive Patient Education  2018 Elsevier Inc.  

## 2017-05-05 LAB — LIPID PANEL
Chol/HDL Ratio: 3.9 ratio (ref 0.0–5.0)
Cholesterol, Total: 162 mg/dL (ref 100–199)
HDL: 42 mg/dL (ref >39)
LDL Calculated: 92 mg/dL (ref 0–99)
Triglycerides: 140 mg/dL (ref 0–149)
VLDL Cholesterol Cal: 28 mg/dL (ref 5–40)

## 2017-05-05 LAB — HEMOGLOBIN A1C
Est. average glucose Bld gHb Est-mCnc: 105 mg/dL
Hgb A1c MFr Bld: 5.3 % (ref 4.8–5.6)

## 2017-05-05 LAB — PSA: Prostate Specific Ag, Serum: 0.7 ng/mL (ref 0.0–4.0)

## 2017-05-16 ENCOUNTER — Encounter: Payer: Self-pay | Admitting: Family Medicine

## 2018-09-19 ENCOUNTER — Encounter: Payer: Self-pay | Admitting: Registered Nurse

## 2018-09-19 ENCOUNTER — Other Ambulatory Visit: Payer: Self-pay

## 2018-09-19 ENCOUNTER — Ambulatory Visit: Payer: BC Managed Care – PPO | Admitting: Registered Nurse

## 2018-09-19 VITALS — BP 132/80 | HR 63 | Temp 98.5°F | Resp 16 | Ht 69.0 in | Wt 203.0 lb

## 2018-09-19 DIAGNOSIS — B029 Zoster without complications: Secondary | ICD-10-CM

## 2018-09-19 MED ORDER — VALACYCLOVIR HCL 1 G PO TABS: 1000.0000 mg | ORAL_TABLET | Freq: Three times a day (TID) | ORAL | 0 refills | Status: DC

## 2018-09-19 NOTE — Progress Notes (Signed)
Acute Office Visit  Subjective:    Patient ID: Wayne Edwards, male    DOB: [birth year], 43 y.o.   MRN: [id number]  Chief Complaint  Patient presents with  . Rash    pt states he noticed the rash friday that has been itchy and painful. Started on the back and now on the abdomin area     HPI Patient is in today for rash.   Onset Friday evening. Progressively worsened until Monday, steady since. Vesicular. Some weeping. Pain, itching, discomfort. Started to L of sternum, has progressed around ribs to L of spine.  Remote history of varicella as a child - does not specifically remember when, perhaps as infant? No known sick contacts. No known exposure to plant, chemical, or other irritants.    Past Medical History:  Diagnosis Date  . Kidney stones     Past Surgical History:  Procedure Laterality Date  . LITHOTRIPSY    . VASECTOMY    . WISDOM TOOTH EXTRACTION      Family History  Problem Relation Age of Onset  . Healthy Mother   . Healthy Father   . Healthy Sister   . Healthy Daughter   . Healthy Son   . Hyperlipidemia Maternal Grandmother   . Healthy Maternal Grandfather   . Stroke Paternal Grandmother   . Healthy Paternal Grandfather     Social History   Socioeconomic History  . Marital status: Married    Spouse name: Not on file  . Number of children: Not on file  . Years of education: Not on file  . Highest education level: Not on file  Occupational History  . Not on file  Social Needs  . Financial resource strain: Not on file  . Food insecurity    Worry: Not on file    Inability: Not on file  . Transportation needs    Medical: Not on file    Non-medical: Not on file  Tobacco Use  . Smoking status: Never Smoker  . Smokeless tobacco: Never Used  Substance and Sexual Activity  . Alcohol use: Yes    Comment: rarely  . Drug use: No  . Sexual activity: Yes  Lifestyle  . Physical activity    Days per week: Not on file    Minutes per session: Not on  file  . Stress: Not on file  Relationships  . Social Herbalist on phone: Not on file    Gets together: Not on file    Attends religious service: Not on file    Active member of club or organization: Not on file    Attends meetings of clubs or organizations: Not on file    Relationship status: Not on file  . Intimate partner violence    Fear of current or ex partner: Not on file    Emotionally abused: Not on file    Physically abused: Not on file    Forced sexual activity: Not on file  Other Topics Concern  . Not on file  Social History Narrative  . Not on file    No outpatient medications prior to visit.   No facility-administered medications prior to visit.     Allergies  Allergen Reactions  . Yellow Jacket Venom [Bee Venom] Anaphylaxis    ROS Per hpi     Objective:    Physical Exam  Constitutional: He is oriented to person, place, and time. He appears well-developed and well-nourished. No distress.  Cardiovascular: Normal rate and  regular rhythm.  Pulmonary/Chest: Effort normal. No respiratory distress.  Neurological: He is alert and oriented to person, place, and time.  Skin: Skin is warm. Rash noted. He is not diaphoretic. There is erythema. No pallor.     Psychiatric: He has a normal mood and affect. His behavior is normal. Judgment and thought content normal.  Nursing note and vitals reviewed.   BP 132/80   Pulse 63   Temp 98.5 F (36.9 C) (Oral)   Resp 16   Ht _0  (1.753 m)   Wt 203 lb (92.1 kg)   SpO2 98%   BMI 29.98 kg/m  Wt Readings from Last 3 Encounters:  09/19/18 203 lb (92.1 kg)  05/04/17 204 lb (92.5 kg)  08/08/13 173 lb (78.5 kg)    Health Maintenance Due  Topic Date Due  . HIV Screening  08/04/1990    There are no preventive care reminders to display for this patient.   No results found for: TSH No results found for: WBC, HGB, HCT, MCV, PLT No results found for: NA, K, CHLORIDE, CO2, GLUCOSE, BUN, CREATININE,  BILITOT, ALKPHOS, AST, ALT, PROT, ALBUMIN, CALCIUM, ANIONGAP, EGFR, GFR Lab Results  Component Value Date   CHOL 162 05/04/2017   Lab Results  Component Value Date   HDL 42 05/04/2017   Lab Results  Component Value Date   LDLCALC 92 05/04/2017   Lab Results  Component Value Date   TRIG 140 05/04/2017   Lab Results  Component Value Date   CHOLHDL 3.9 05/04/2017   Lab Results  Component Value Date   HGBA1C 5.3 05/04/2017       Assessment & Plan:   Problem List Items Addressed This Visit    None    Visit Diagnoses    Herpes zoster without complication    -  Primary   Relevant Medications   valACYclovir (VALTREX) 1000 MG tablet       Meds ordered this encounter  Medications  . valACYclovir (VALTREX) 1000 MG tablet    Sig: Take 1 tablet (1,000 mg total) by mouth 3 (three) times daily.    Dispense:  21 tablet    Refill:  0    Order Specific Question:   Supervising Provider    Answer:   Forrest Moron [id number]    PLAN  Typical presentation of herpes zoster.   Valacyclovir 1g tid for 7 days  Discussed limitations of delayed start of treatment, contagiousness of rash, and reasons to be concerned.  Patient encouraged to call clinic with any questions, comments, or concerns.   Maximiano Coss, NP

## 2018-09-19 NOTE — Patient Instructions (Signed)
° ° ° °  If you have lab work done today you will be contacted with your lab results within the next 2 weeks.  If you have not heard from us then please contact us. The fastest way to get your results is to register for My Chart. ° ° °IF you received an x-ray today, you will receive an invoice from [facility]. Please contact [facility] at [phone] with questions or concerns regarding your invoice.  ° °IF you received labwork today, you will receive an invoice from [facility]. Please contact [facility] at [phone] with questions or concerns regarding your invoice.  ° °Our billing staff will not be able to assist you with questions regarding bills from these companies. ° °You will be contacted with the lab results as soon as they are available. The fastest way to get your results is to activate your My Chart account. Instructions are located on the last page of this paperwork. If you have not heard from us regarding the results in 2 weeks, please contact this office. °  ° ° ° °

## 2019-03-07 ENCOUNTER — Ambulatory Visit: Payer: BC Managed Care – PPO | Attending: Family

## 2019-03-07 ENCOUNTER — Other Ambulatory Visit: Payer: Self-pay

## 2019-03-07 DIAGNOSIS — Z23 Encounter for immunization: Secondary | ICD-10-CM

## 2019-03-07 NOTE — Progress Notes (Signed)
   Covid-19 Vaccination Clinic  Name:  Wayne Edwards    MRN: [id number] DOB: 02/14/1975  03/07/2019  Mr. Wayne Edwards was observed post Covid-19 immunization for 15 minutes without incidence. He was provided with Vaccine Information Sheet and instruction to access the V-Safe system.   Mr. Wayne Edwards was instructed to call 911 with any severe reactions post vaccine: Marland Kitchen Difficulty breathing  . Swelling of your face and throat  . A fast heartbeat  . A bad rash all over your body  . Dizziness and weakness    Immunizations Administered    Name Date Dose VIS Date Route   Moderna COVID-19 Vaccine 03/07/2019  2:25 PM 0.5 mL 12/11/2018 Intramuscular   Manufacturer: Moderna   Lot: [id number]   NDC: 87681-157-26

## 2019-04-09 ENCOUNTER — Ambulatory Visit: Payer: BC Managed Care – PPO | Attending: Internal Medicine

## 2019-04-09 DIAGNOSIS — Z23 Encounter for immunization: Secondary | ICD-10-CM

## 2019-04-09 NOTE — Progress Notes (Signed)
   Covid-19 Vaccination Clinic  Name:  Wayne Edwards    MRN: [id number] DOB: Jan 16, 1975  04/09/2019  [name] was observed post Covid-19 immunization for 30 minutes based on pre-vaccination screening without incident. He was provided with Vaccine Information Sheet and instruction to access the V-Safe system.   [name] was instructed to call 911 with any severe reactions post vaccine: Marland Kitchen Difficulty breathing  . Swelling of face and throat  . A fast heartbeat  . A bad rash all over body  . Dizziness and weakness   Immunizations Administered    Name Date Dose VIS Date Route   Moderna COVID-19 Vaccine 04/09/2019  9:13 AM 0.5 mL 12/11/2018 Intramuscular   Manufacturer: Moderna   Lot: [id number]   NDC: 75612-548-32

## 2019-09-27 ENCOUNTER — Other Ambulatory Visit: Payer: Self-pay

## 2019-09-27 ENCOUNTER — Encounter: Payer: Self-pay | Admitting: Family Medicine

## 2019-09-27 ENCOUNTER — Ambulatory Visit (INDEPENDENT_AMBULATORY_CARE_PROVIDER_SITE_OTHER): Payer: BC Managed Care – PPO | Admitting: Family Medicine

## 2019-09-27 VITALS — BP 131/89 | HR 69 | Temp 98.1°F | Ht 69.0 in | Wt 211.0 lb

## 2019-09-27 DIAGNOSIS — Z13228 Encounter for screening for other metabolic disorders: Secondary | ICD-10-CM

## 2019-09-27 DIAGNOSIS — Z0001 Encounter for general adult medical examination with abnormal findings: Secondary | ICD-10-CM

## 2019-09-27 DIAGNOSIS — Z130 Encounter for screening for diseases of the blood and blood-forming organs and certain disorders involving the immune mechanism: Secondary | ICD-10-CM

## 2019-09-27 DIAGNOSIS — Z131 Encounter for screening for diabetes mellitus: Secondary | ICD-10-CM | POA: Diagnosis not present

## 2019-09-27 DIAGNOSIS — Z1329 Encounter for screening for other suspected endocrine disorder: Secondary | ICD-10-CM

## 2019-09-27 DIAGNOSIS — Z13220 Encounter for screening for lipoid disorders: Secondary | ICD-10-CM

## 2019-09-27 DIAGNOSIS — R8290 Unspecified abnormal findings in urine: Secondary | ICD-10-CM

## 2019-09-27 DIAGNOSIS — Z0000 Encounter for general adult medical examination without abnormal findings: Secondary | ICD-10-CM

## 2019-09-27 DIAGNOSIS — Z1389 Encounter for screening for other disorder: Secondary | ICD-10-CM

## 2019-09-27 LAB — POCT URINALYSIS DIP (MANUAL ENTRY)
Bilirubin, UA: NEGATIVE
Glucose, UA: NEGATIVE mg/dL
Ketones, POC UA: NEGATIVE mg/dL
Leukocytes, UA: NEGATIVE
Nitrite, UA: NEGATIVE
Spec Grav, UA: >=1.03 — AB (ref 1.010–1.025)
Urobilinogen, UA: 0.2 E.U./dL
pH, UA: 6 (ref 5.0–8.0)

## 2019-09-27 LAB — POC MICROSCOPIC URINALYSIS (UMFC): Mucus: ABSENT

## 2019-09-27 NOTE — Patient Instructions (Addendum)
   If you have lab work done today you will be contacted with your lab results within the next 2 weeks.  If you have not heard from us then please contact us. The fastest way to get your results is to register for My Chart.   IF you received an x-ray today, you will receive an invoice from [facility]. Please contact [facility] at [phone] with questions or concerns regarding your invoice.   IF you received labwork today, you will receive an invoice from [facility]. Please contact [facility] at [phone] with questions or concerns regarding your invoice.   Our billing staff will not be able to assist you with questions regarding bills from these companies.  You will be contacted with the lab results as soon as they are available. The fastest way to get your results is to activate your My Chart account. Instructions are located on the last page of this paperwork. If you have not heard from us regarding the results in 2 weeks, please contact this office.     Preventive Care 40-64 Years Old, Male Preventive care refers to lifestyle choices and visits with your health care provider that can promote health and wellness. This includes:  A yearly physical exam. This is also called an annual well check.  Regular dental and eye exams.  Immunizations.  Screening for certain conditions.  Healthy lifestyle choices, such as eating a healthy diet, getting regular exercise, not using drugs or products that contain nicotine and tobacco, and limiting alcohol use. What can I expect for my preventive care visit? Physical exam Your health care provider will check:  Height and weight. These may be used to calculate body mass index (BMI), which is a measurement that tells if you are at a healthy weight.  Heart rate and blood pressure.  Your skin for abnormal spots. Counseling Your health care provider may ask you questions about:  Alcohol, tobacco, and drug use.  Emotional  well-being.  Home and relationship well-being.  Sexual activity.  Eating habits.  Work and work environment. What immunizations do I need?  Influenza (flu) vaccine  This is recommended every year. Tetanus, diphtheria, and pertussis (Tdap) vaccine  You may need a Td booster every 10 years. Varicella (chickenpox) vaccine  You may need this vaccine if you have not already been vaccinated. Zoster (shingles) vaccine  You may need this after age 60. Measles, mumps, and rubella (MMR) vaccine  You may need at least one dose of MMR if you were born in 1957 or later. You may also need a second dose. Pneumococcal conjugate (PCV13) vaccine  You may need this if you have certain conditions and were not previously vaccinated. Pneumococcal polysaccharide (PPSV23) vaccine  You may need one or two doses if you smoke cigarettes or if you have certain conditions. Meningococcal conjugate (MenACWY) vaccine  You may need this if you have certain conditions. Hepatitis A vaccine  You may need this if you have certain conditions or if you travel or work in places where you may be exposed to hepatitis A. Hepatitis B vaccine  You may need this if you have certain conditions or if you travel or work in places where you may be exposed to hepatitis B. Haemophilus influenzae type b (Hib) vaccine  You may need this if you have certain risk factors. Human papillomavirus (HPV) vaccine  If recommended by your health care provider, you may need three doses over 6 months. You may receive vaccines as individual doses   or as more than one vaccine together in one shot (combination vaccines). Talk with your health care provider about the risks and benefits of combination vaccines. What tests do I need? Blood tests  Lipid and cholesterol levels. These may be checked every 5 years, or more frequently if you are over 62 years old.  Hepatitis C test.  Hepatitis B test. Screening  Lung cancer screening.  You may have this screening every year starting at age 62 if you have a 30-pack-year history of smoking and currently smoke or have quit within the past 15 years.  Prostate cancer screening. Recommendations will vary depending on your family history and other risks.  Colorectal cancer screening. All adults should have this screening starting at age 63 and continuing until age 34. Your health care provider may recommend screening at age 43 if you are at increased risk. You will have tests every 1-10 years, depending on your results and the type of screening test.  Diabetes screening. This is done by checking your blood sugar (glucose) after you have not eaten for a while (fasting). You may have this done every 1-3 years.  Sexually transmitted disease (STD) testing. Follow these instructions at home: Eating and drinking  Eat a diet that includes fresh fruits and vegetables, whole grains, lean protein, and low-fat dairy products.  Take vitamin and mineral supplements as recommended by your health care provider.  Do not drink alcohol if your health care provider tells you not to drink.  If you drink alcohol: ? Limit how much you have to 0-2 drinks a day. ? Be aware of how much alcohol is in your drink. In the [place] one drink equals one 12 oz bottle of beer (355 mL), one 5 oz glass of wine (148 mL), or one 1 oz glass of hard liquor (44 mL). Lifestyle  Take daily care of your teeth and gums.  Stay active. Exercise for at least 30 minutes on 5 or more days each week.  Do not use any products that contain nicotine or tobacco, such as cigarettes, e-cigarettes, and chewing tobacco. If you need help quitting, ask your health care provider.  If you are sexually active, practice safe sex. Use a condom or other form of protection to prevent STIs (sexually transmitted infections).  Talk with your health care provider about taking a low-dose aspirin every day starting at age 70. What's next?  Go  to your health care provider once a year for a well check visit.  Ask your health care provider how often you should have your eyes and teeth checked.  Stay up to date on all vaccines. This information is not intended to replace advice given to you by your health care provider. Make sure you discuss any questions you have with your health care provider. Document Revised: 12/21/2017 Document Reviewed: 12/21/2017 Elsevier Patient Education  2020 [employer].

## 2019-09-27 NOTE — Progress Notes (Signed)
9/17/20213:26 PM  Edison Nasuti 09/29/1975, 44 y.o., male [id number]  Chief Complaint  Patient presents with  . Annual Exam    wants to talk about lifestyle and wellness    HPI:   Patient is a 44 y.o. male  who presents today for CPE  Last CPE 2019 He is doing well and has no acute concerns  He continues to work as a Runner, broadcasting/film/video He usually does well with calorie counting but has had some struggles  He is currently being calorie conscious and IF (noon - 9pm) He is not getting hungry In the past 2 months he has lost 15 lbs Does not smoke, 2-3 drinks week, no IVDU Scheduled for routine dental cleaning Wears eyeglasses, sees yearly Denies fhx of CAD, DM2, prostate or colon cancer No exercising regularly, tries to walk about an hour a day, will add weight lifting Will be getting flu vaccine at [facility] - gets $10 gift card  Health Maintenance Due  Topic Date Due  . Hepatitis C Screening  Never done  . HIV Screening  Never done  . INFLUENZA VACCINE  08/11/2019   Most Recent Immunizations  Administered Date(s) Administered  . Influenza,inj,quad, With Preservative 09/16/2018  . Moderna SARS-COVID-2 Vaccination 04/09/2019  . Td 05/04/2017    Hearing Screening   125Hz  250Hz  500Hz  1000Hz  2000Hz  3000Hz  4000Hz  6000Hz  8000Hz   Right ear:           Left ear:             Visual Acuity Screening   Right eye Left eye Both eyes  Without correction:     With correction: 20/20 20/20 20/20     Wt Readings from Last 3 Encounters:  09/27/19 221 lb (100.2 kg)  09/19/18 203 lb (92.1 kg)  05/04/17 204 lb (92.5 kg)   BP Readings from Last 3 Encounters:  09/27/19 131/89  09/19/18 132/80  05/04/17 102/84    Depression screen PHQ 2/9 09/27/2019 09/19/2018 05/04/2017  Decreased Interest 0 0 0  Down, Depressed, Hopeless 0 0 0  PHQ - 2 Score 0 0 0    Fall Risk  09/27/2019 09/19/2018 05/04/2017  Falls in the past year? 0 0 No  Number falls in past yr: 0 0 -  Injury with Fall? 0 0 -      Allergies  Allergen Reactions  . Yellow Jacket Venom [Bee Venom] Anaphylaxis    Prior to Admission medications   Not on File    Past Medical History:  Diagnosis Date  . Kidney stones     Past Surgical History:  Procedure Laterality Date  . LITHOTRIPSY    . VASECTOMY    . WISDOM TOOTH EXTRACTION      Social History   Tobacco Use  . Smoking status: Never Smoker  . Smokeless tobacco: Never Used  Substance Use Topics  . Alcohol use: Yes    Comment: rarely    Family History  Problem Relation Age of Onset  . Healthy Mother   . Healthy Father   . Healthy Sister   . Healthy Daughter   . Healthy Son   . Hyperlipidemia Maternal Grandmother   . Healthy Maternal Grandfather   . Stroke Paternal Grandmother   . Healthy Paternal Grandfather     Review of Systems  Constitutional: Negative for chills, diaphoresis, fever and malaise/fatigue.  HENT: Negative for hearing loss, sore throat and tinnitus.   Eyes: Negative for blurred vision and double vision.  Respiratory: Negative for cough and shortness of  breath.   Cardiovascular: Negative for chest pain, palpitations and leg swelling.  Gastrointestinal: Negative for abdominal pain, blood in stool, constipation, diarrhea, melena, nausea and vomiting.  Genitourinary: Negative for dysuria, flank pain, frequency, hematuria and urgency.  Musculoskeletal: Negative for joint pain and myalgias.  Neurological: Negative for dizziness, tingling, focal weakness and headaches.  Endo/Heme/Allergies: Negative for polydipsia.  Psychiatric/Behavioral: Negative for depression. The patient is not nervous/anxious and does not have insomnia.      OBJECTIVE:  Today's Vitals   09/27/19 1516  BP: 131/89  Pulse: 69  Temp: 98.1 F (36.7 C)  SpO2: 97%  Weight: 221 lb (100.2 kg)  Height: 5\' 9"  (1.753 m)   Body mass index is 32.64 kg/m.   Physical Exam Vitals and nursing note reviewed.  Constitutional:      Appearance: He is  well-developed.  HENT:     Head: Normocephalic and atraumatic.     Right Ear: Hearing, tympanic membrane, ear canal and external ear normal.     Left Ear: Hearing, tympanic membrane, ear canal and external ear normal.     Mouth/Throat:     Pharynx: No oropharyngeal exudate.  Eyes:     Extraocular Movements: Extraocular movements intact.     Conjunctiva/sclera: Conjunctivae normal.     Pupils: Pupils are equal, round, and reactive to light.  Neck:     Thyroid: No thyromegaly.  Cardiovascular:     Rate and Rhythm: Normal rate and regular rhythm.     Heart sounds: Normal heart sounds. No murmur heard.  No friction rub. No gallop.   Pulmonary:     Effort: Pulmonary effort is normal.     Breath sounds: Normal breath sounds. No wheezing, rhonchi or rales.  Abdominal:     General: Bowel sounds are normal. There is no distension.     Palpations: Abdomen is soft. There is no mass.     Tenderness: There is no abdominal tenderness.  Musculoskeletal:        General: Normal range of motion.     Cervical back: Neck supple.     Right lower leg: No edema.     Left lower leg: No edema.  Lymphadenopathy:     Cervical: No cervical adenopathy.  Skin:    General: Skin is warm and dry.  Neurological:     Mental Status: He is alert and oriented to person, place, and time.     Cranial Nerves: No cranial nerve deficit.     Coordination: Coordination normal.     Gait: Gait normal.     Deep Tendon Reflexes: Reflexes are normal and symmetric.  Psychiatric:        Mood and Affect: Mood normal.        Behavior: Behavior normal.     Results for orders placed or performed in visit on 09/27/19 (from the past 24 hour(s))  POCT urinalysis dipstick     Status: Abnormal   Collection Time: 09/27/19  3:48 PM  Result Value Ref Range   Color, UA yellow yellow   Clarity, UA clear clear   Glucose, UA negative negative mg/dL   Bilirubin, UA negative negative   Ketones, POC UA negative negative mg/dL   Spec  Grav, UA 09/29/19 (A) 1.010 - 1.025   Blood, UA moderate (A) negative   pH, UA 6.0 5.0 - 8.0   Protein Ur, POC trace (A) negative mg/dL   Urobilinogen, UA 0.2 0.2 or 1.0 E.U./dL   Nitrite, UA Negative Negative   Leukocytes,  UA Negative Negative  POCT Microscopic Urinalysis ([facility])     Status: Abnormal   Collection Time: 09/27/19  4:07 PM  Result Value Ref Range   WBC,UR,HPF,POC Few (A) None WBC/hpf   RBC,UR,HPF,POC Few (A) None RBC/hpf   Bacteria None None, Too numerous to count   Mucus Absent Absent   Epithelial Cells, UR Per Microscopy Few (A) None, Too numerous to count cells/hpf    No results found.   ASSESSMENT and PLAN  1. Annual physical exam No concerns per history or exam. Routine HCM labs ordered. HCM reviewed/discussed. Anticipatory guidance regarding healthy weight, lifestyle and choices given.   2. Screening for blood or protein in urine - POCT urinalysis dipstick - POCT Microscopic Urinalysis ([facility])  3. Screening for lipid disorders - Lipid panel  4. Screening for diabetes mellitus - Hemoglobin A1c  5. Screening for endocrine, metabolic and immunity disorder - Comprehensive metabolic panel  6. Abnormal urinalysis Patient with h/o renal stones but no symptoms, if culture negative refer to urology - Urine Culture  Return in about 1 year (around 09/26/2020).    Myles Lipps, MD [facility] at [facility] [address] [place], [place] [address] Ph.  [phone] Fax [phone]

## 2019-09-28 LAB — COMPREHENSIVE METABOLIC PANEL
ALT: 19 IU/L (ref 0–44)
AST: 19 IU/L (ref 0–40)
Albumin/Globulin Ratio: 1.6 (ref 1.2–2.2)
Albumin: 4.4 g/dL (ref 4.0–5.0)
Alkaline Phosphatase: 85 IU/L (ref 44–121)
BUN/Creatinine Ratio: 13 (ref 9–20)
BUN: 14 mg/dL (ref 6–24)
Bilirubin Total: 0.4 mg/dL (ref 0.0–1.2)
CO2: 25 mmol/L (ref 20–29)
Calcium: 9.4 mg/dL (ref 8.7–10.2)
Chloride: 103 mmol/L (ref 96–106)
Creatinine, Ser: 1.08 mg/dL (ref 0.76–1.27)
GFR calc Af Amer: 96 mL/min/{1.73_m2} (ref >59)
GFR calc non Af Amer: 83 mL/min/{1.73_m2} (ref >59)
Globulin, Total: 2.7 g/dL (ref 1.5–4.5)
Glucose: 80 mg/dL (ref 65–99)
Potassium: 4.5 mmol/L (ref 3.5–5.2)
Sodium: 141 mmol/L (ref 134–144)
Total Protein: 7.1 g/dL (ref 6.0–8.5)

## 2019-09-28 LAB — LIPID PANEL
Chol/HDL Ratio: 3.5 ratio (ref 0.0–5.0)
Cholesterol, Total: 171 mg/dL (ref 100–199)
HDL: 49 mg/dL (ref >39)
LDL Chol Calc (NIH): 105 mg/dL — ABNORMAL HIGH (ref 0–99)
Triglycerides: 94 mg/dL (ref 0–149)
VLDL Cholesterol Cal: 17 mg/dL (ref 5–40)

## 2019-09-28 LAB — HEMOGLOBIN A1C
Est. average glucose Bld gHb Est-mCnc: 94 mg/dL
Hgb A1c MFr Bld: 4.9 % (ref 4.8–5.6)

## 2019-09-29 LAB — URINE CULTURE

## 2019-10-04 ENCOUNTER — Encounter: Payer: Self-pay | Admitting: Radiology

## 2020-03-27 ENCOUNTER — Encounter: Payer: BC Managed Care – PPO | Admitting: Registered Nurse

## 2020-09-28 ENCOUNTER — Encounter: Payer: Self-pay | Admitting: Registered Nurse

## 2023-01-27 ENCOUNTER — Inpatient Hospital Stay (HOSPITAL_COMMUNITY): Payer: 59

## 2023-01-27 ENCOUNTER — Emergency Department (HOSPITAL_COMMUNITY): Payer: 59

## 2023-01-27 ENCOUNTER — Encounter (HOSPITAL_COMMUNITY): Payer: Self-pay | Admitting: Emergency Medicine

## 2023-01-27 ENCOUNTER — Observation Stay (HOSPITAL_COMMUNITY)
Admission: EM | Admit: 2023-01-27 | Discharge: 2023-01-29 | Disposition: A | Discharge status: Home / Self Care | Payer: 59 | Attending: Internal Medicine | Admitting: Internal Medicine

## 2023-01-27 ENCOUNTER — Other Ambulatory Visit: Payer: Self-pay

## 2023-01-27 DIAGNOSIS — Z6834 Body mass index (BMI) 34.0-34.9, adult: Secondary | ICD-10-CM | POA: Insufficient documentation

## 2023-01-27 DIAGNOSIS — I639 Cerebral infarction, unspecified: Principal | ICD-10-CM

## 2023-01-27 DIAGNOSIS — F1090 Alcohol use, unspecified, uncomplicated: Secondary | ICD-10-CM | POA: Insufficient documentation

## 2023-01-27 DIAGNOSIS — I6381 Other cerebral infarction due to occlusion or stenosis of small artery: Secondary | ICD-10-CM | POA: Diagnosis not present

## 2023-01-27 DIAGNOSIS — Z79899 Other long term (current) drug therapy: Secondary | ICD-10-CM | POA: Diagnosis not present

## 2023-01-27 DIAGNOSIS — I63531 Cerebral infarction due to unspecified occlusion or stenosis of right posterior cerebral artery: Principal | ICD-10-CM | POA: Insufficient documentation

## 2023-01-27 DIAGNOSIS — Z7901 Long term (current) use of anticoagulants: Secondary | ICD-10-CM | POA: Insufficient documentation

## 2023-01-27 DIAGNOSIS — I63331 Cerebral infarction due to thrombosis of right posterior cerebral artery: Secondary | ICD-10-CM

## 2023-01-27 DIAGNOSIS — R531 Weakness: Secondary | ICD-10-CM | POA: Diagnosis present

## 2023-01-27 DIAGNOSIS — E66811 Obesity, class 1: Secondary | ICD-10-CM | POA: Insufficient documentation

## 2023-01-27 DIAGNOSIS — R030 Elevated blood-pressure reading, without diagnosis of hypertension: Secondary | ICD-10-CM | POA: Diagnosis not present

## 2023-01-27 LAB — BASIC METABOLIC PANEL
Anion gap: 10 (ref 5–15)
BUN: 13 mg/dL (ref 6–20)
CO2: 25 mmol/L (ref 22–32)
Calcium: 9.1 mg/dL (ref 8.9–10.3)
Chloride: 103 mmol/L (ref 98–111)
Creatinine, Ser: 1.19 mg/dL (ref 0.61–1.24)
GFR, Estimated: >60 mL/min (ref >60)
Glucose, Bld: 99 mg/dL (ref 70–99)
Potassium: 3.7 mmol/L (ref 3.5–5.1)
Sodium: 138 mmol/L (ref 135–145)

## 2023-01-27 LAB — HIV ANTIBODY (ROUTINE TESTING W REFLEX): HIV Screen 4th Generation wRfx: NONREACTIVE

## 2023-01-27 LAB — CBC
HCT: 42.7 % (ref 39.0–52.0)
Hemoglobin: 14.5 g/dL (ref 13.0–17.0)
MCH: 30.7 pg (ref 26.0–34.0)
MCHC: 34 g/dL (ref 30.0–36.0)
MCV: 90.5 fL (ref 80.0–100.0)
Platelets: 296 10*3/uL (ref 150–400)
RBC: 4.72 MIL/uL (ref 4.22–5.81)
RDW: 12.4 % (ref 11.5–15.5)
WBC: 5.7 10*3/uL (ref 4.0–10.5)
nRBC: 0 % (ref 0.0–0.2)

## 2023-01-27 LAB — CBG MONITORING, ED: Glucose-Capillary: 97 mg/dL (ref 70–99)

## 2023-01-27 MED ORDER — HYDRALAZINE HCL 20 MG/ML IJ SOLN: 10.0000 mg | Freq: Four times a day (QID) | INTRAMUSCULAR | Status: DC | PRN

## 2023-01-27 MED ORDER — ACETAMINOPHEN 160 MG/5ML PO SOLN: 650.0000 mg | ORAL | Status: DC | PRN

## 2023-01-27 MED ORDER — ASPIRIN 81 MG PO TBEC
81.0000 mg | DELAYED_RELEASE_TABLET | Freq: Every day | ORAL | Status: DC
  Administered 2023-01-27 – 2023-01-29 (×3): 81 mg via ORAL
  Filled 2023-01-27 (×3): qty 1

## 2023-01-27 MED ORDER — SODIUM CHLORIDE 0.9 % IV SOLN: INTRAVENOUS | Status: DC

## 2023-01-27 MED ORDER — ONDANSETRON HCL 4 MG PO TABS: 4.0000 mg | ORAL_TABLET | Freq: Four times a day (QID) | ORAL | Status: DC | PRN

## 2023-01-27 MED ORDER — ACETAMINOPHEN 325 MG PO TABS: 650.0000 mg | ORAL_TABLET | Freq: Four times a day (QID) | ORAL | Status: DC | PRN

## 2023-01-27 MED ORDER — ACETAMINOPHEN 325 MG PO TABS: 650.0000 mg | ORAL_TABLET | ORAL | Status: DC | PRN

## 2023-01-27 MED ORDER — ONDANSETRON HCL 4 MG/2ML IJ SOLN: 4.0000 mg | Freq: Four times a day (QID) | INTRAMUSCULAR | Status: DC | PRN

## 2023-01-27 MED ORDER — CLOPIDOGREL BISULFATE 75 MG PO TABS
75.0000 mg | ORAL_TABLET | Freq: Every day | ORAL | Status: DC
Start: 2023-01-28 — End: 2023-01-29
  Administered 2023-01-28 – 2023-01-29 (×2): 75 mg via ORAL
  Filled 2023-01-27 (×2): qty 1

## 2023-01-27 MED ORDER — ACETAMINOPHEN 650 MG RE SUPP: 650.0000 mg | RECTAL | Status: DC | PRN

## 2023-01-27 MED ORDER — IOHEXOL 350 MG/ML SOLN
75.0000 mL | Freq: Once | INTRAVENOUS | Status: AC | PRN
  Administered 2023-01-27: 75 mL via INTRAVENOUS

## 2023-01-27 MED ORDER — SENNOSIDES-DOCUSATE SODIUM 8.6-50 MG PO TABS: 1.0000 | ORAL_TABLET | Freq: Every evening | ORAL | Status: DC | PRN

## 2023-01-27 MED ORDER — ACETAMINOPHEN 650 MG RE SUPP: 650.0000 mg | Freq: Four times a day (QID) | RECTAL | Status: DC | PRN

## 2023-01-27 MED ORDER — SORBITOL 70 % SOLN: 30.0000 mL | Freq: Every day | Status: DC | PRN

## 2023-01-27 MED ORDER — ALBUTEROL SULFATE (2.5 MG/3ML) 0.083% IN NEBU: 2.5000 mg | INHALATION_SOLUTION | RESPIRATORY_TRACT | Status: DC | PRN

## 2023-01-27 MED ORDER — STROKE: EARLY STAGES OF RECOVERY BOOK
Freq: Once | Status: AC
  Filled 2023-01-27: qty 1

## 2023-01-27 MED ORDER — ENOXAPARIN SODIUM 40 MG/0.4ML IJ SOSY
40.0000 mg | PREFILLED_SYRINGE | INTRAMUSCULAR | Status: DC
  Administered 2023-01-27 – 2023-01-28 (×2): 40 mg via SUBCUTANEOUS
  Filled 2023-01-27 (×2): qty 0.4

## 2023-01-27 MED ORDER — MAGNESIUM CITRATE PO SOLN: 1.0000 | Freq: Once | ORAL | Status: DC | PRN

## 2023-01-27 MED ORDER — OXYCODONE HCL 5 MG PO TABS: 5.0000 mg | ORAL_TABLET | ORAL | Status: DC | PRN

## 2023-01-27 MED ORDER — CLOPIDOGREL BISULFATE 300 MG PO TABS
300.0000 mg | ORAL_TABLET | Freq: Once | ORAL | Status: AC
  Administered 2023-01-27: 300 mg via ORAL
  Filled 2023-01-27: qty 1

## 2023-01-27 NOTE — Evaluation (Signed)
Occupational Therapy Evaluation Patient Details Name: Wayne Edwards MRN: [id number] DOB: 09/29/1975 Today's Date: 01/27/2023   History of Present Illness 48 y.o. male with no significant medical history presenting to the ED with a several history of left-sided weakness.  MRI: 1.  8 mm acute infarct within the right thalamus.  2. 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  3. 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   Clinical Impression   Patient admitted for the diagnosis above.  PTA he lives at home with his spouse, and remains independent with all mobility, ADL,iADL and continues to work as a Engineer, site.  Currently he endorses a resolution of all symptoms except sensory deficits to his left leg.  He describes it as if his "leg is walking through water."  Patient was able to walk the ED halls to the restroom with no assist, essentially Mod I with no AD.  Patient is not presenting with any  ADL challenges, and is very close to his baseline.  OT will continue efforts in the acute setting to ensure there are no changes.  No post acute OT anticipated.        If plan is discharge home, recommend the following: Assist for transportation    Functional Status Assessment  Patient has not had a recent decline in their functional status  Equipment Recommendations  None recommended by OT    Recommendations for Other Services       Precautions / Restrictions Precautions Precautions: Fall Restrictions Weight Bearing Restrictions Per Provider Order: No      Mobility Bed Mobility Overal bed mobility: Independent                  Transfers Overall transfer level: Modified independent Equipment used: None               General transfer comment: Patient continues to experience a heaviness to his L lower      Balance Overall balance  assessment: No apparent balance deficits (not formally assessed)                                         ADL either performed or assessed with clinical judgement   ADL Overall ADL's : Independent                                             Vision Baseline Vision/History: 1 Wears glasses Patient Visual Report: No change from baseline       Perception Perception: Within Functional Limits       Praxis Praxis: WFL       Pertinent Vitals/Pain Pain Assessment Pain Assessment: No/denies pain     Extremity/Trunk Assessment Upper Extremity Assessment Upper Extremity Assessment: Overall WFL for tasks assessed;Right hand dominant;LUE deficits/detail LUE Deficits / Details: very mild sensory changes. LUE Sensation: WNL LUE Coordination: WNL   Lower Extremity Assessment Lower Extremity Assessment: Defer to PT evaluation   Cervical / Trunk Assessment Cervical / Trunk Assessment: Normal   Communication Communication Communication: No apparent difficulties   Cognition Arousal: Alert Behavior During Therapy: WFL for tasks assessed/performed Overall Cognitive Status: Within Functional Limits for tasks assessed  General Comments   Watch BP  154/111    Exercises     Shoulder Instructions      Home Living Family/patient expects to be discharged to:: Private residence Living Arrangements: Spouse/significant other Available Help at Discharge: Family;Available 24 hours/day Type of Home: House Home Access: Stairs to enter [employer] of Steps: 3 Entrance Stairs-Rails: None Home Layout: Two level;Bed/bath upstairs Alternate Level Stairs-Number of Steps: full flight Alternate Level Stairs-Rails: Left Bathroom Shower/Tub: Producer, television/film/video: Standard Bathroom Accessibility: Yes How Accessible: Accessible via walker Home Equipment: None          Prior  Functioning/Environment Prior Level of Function : Independent/Modified Independent;Working/employed;Driving               ADLs Comments: Employed as an [name]        OT Problem List: Impaired balance (sitting and/or standing)      OT Treatment/Interventions: Self-care/ADL training;Therapeutic activities    OT Goals(Current goals can be found in the care plan section) Acute Rehab OT Goals Patient Stated Goal: Return home OT Goal Formulation: With patient Time For Goal Achievement: 02/10/23 Potential to Achieve Goals: Good ADL Goals Pt Will Perform Lower Body Dressing: Independently;sit to/from stand;sitting/lateral leans  OT Frequency: Min 1X/week    Co-evaluation              AM-PAC OT "6 Clicks" Daily Activity     Outcome Measure Help from another person eating meals?: None Help from another person taking care of personal grooming?: None Help from another person toileting, which includes using toliet, bedpan, or urinal?: None Help from another person bathing (including washing, rinsing, drying)?: None Help from another person to put on and taking off regular upper body clothing?: None Help from another person to put on and taking off regular lower body clothing?: None 6 Click Score: 24   End of Session Nurse Communication: Mobility status  Activity Tolerance: Patient tolerated treatment well Patient left: in bed;with call bell/phone within reach  OT Visit Diagnosis: Unsteadiness on feet (R26.81)                Time: 2841-3244 OT Time Calculation (min): 21 min Charges:  OT General Charges $OT Visit: 1 Visit OT Evaluation $OT Eval Moderate Complexity: 1 Mod  01/27/2023  RP, OTR/L  Acute Rehabilitation Services  Office:  [phone]   Suzanna Obey 01/27/2023, 5:39 PM

## 2023-01-27 NOTE — Consult Note (Signed)
NEUROLOGY CONSULT NOTE   Date of service: January 27, 2023 Patient Name: Wayne Edwards MRN:  [id number] DOB:  Aug 07, 1975 Chief Complaint: "Left-sided numbness" Requesting Provider: Rodolph Bong, MD  History of Present Illness  Wayne Edwards is a 48 y.o. male who sees a physician on a regular basis, and has been told that his blood pressure was high on a single time but was waiting for repeat measurements to decide about starting medications.  He was in his normal state of health when last night around 10:20 PM he had sudden onset of left-sided numbness involving the arm and leg.  He states that this is in the improving since that time.  When I pressed him on what he means by "numbness" he states that it just is not doing what it was supposed to be doing, but he feels touching things on each side equally.   LKW: 10:20 PM Modified rankin score: 0-Completely asymptomatic and back to baseline post- stroke IV Thrombolysis: Outside of window EVT: No LVO   NIHSS components Score: Comment  1a Level of Conscious 0[x]  1[]  2[]  3[]      1b LOC Questions 0[x]  1[]  2[]       1c LOC Commands 0[x]  1[]  2[]       2 Best Gaze 0[x]  1[]  2[]       3 Visual 0[x]  1[]  2[]  3[]      4 Facial Palsy 0[x]  1[]  2[]  3[]      5a Motor Arm - left 0[]  1[x]  2[]  3[]  4[]  UN[]    5b Motor Arm - Right 0[x]  1[]  2[]  3[]  4[]  UN[]    6a Motor Leg - Left 0[]  1[x]  2[]  3[]  4[]  UN[]    6b Motor Leg - Right 0[x]  1[]  2[]  3[]  4[]  UN[]    7 Limb Ataxia 0[x]  1[]  2[]  3[]  UN[]     8 Sensory 0[x]  1[]  2[]  UN[]      9 Best Language 0[x]  1[]  2[]  3[]      10 Dysarthria 0[x]  1[]  2[]  UN[]      11 Extinct. and Inattention 0[x]  1[]  2[]       TOTAL: Two      Past History  History reviewed. No pertinent past medical history.  Past Surgical History:  Procedure Laterality Date   LITHOTRIPSY     VASECTOMY     WISDOM TOOTH EXTRACTION      Family History: Family History  Problem Relation Age of Onset   Healthy Mother    Healthy Father     Healthy Sister    Healthy Daughter    Healthy Son    Hyperlipidemia Maternal Grandmother    Healthy Maternal Grandfather    Stroke Paternal Grandmother    Healthy Paternal Grandfather     Social History  reports that he has never smoked. He has never used smokeless tobacco. He reports current alcohol use. He reports that he does not use drugs.  Allergies  Allergen Reactions   Yellow Jacket Venom [Bee Venom] Anaphylaxis   Beef-Derived Drug Products    Pork-Derived Products     Medications   Current Facility-Administered Medications:    [START ON 01/28/2023]  stroke: early stages of recovery book, , Does not apply, Once, Rodolph Bong, MD   0.9 %  sodium chloride infusion, , Intravenous, Continuous, Rodolph Bong, MD, Last Rate: 100 mL/hr at 01/27/23 1742, New Bag at 01/27/23 1742   acetaminophen (TYLENOL) tablet 650 mg, 650 mg, Oral, Q4H PRN **OR** acetaminophen (TYLENOL) 160 MG/5ML solution 650 mg, 650 mg, Per Tube, Q4H PRN **  OR** acetaminophen (TYLENOL) suppository 650 mg, 650 mg, Rectal, Q4H PRN, Rodolph Bong, MD   albuterol (PROVENTIL) (2.5 MG/3ML) 0.083% nebulizer solution 2.5 mg, 2.5 mg, Nebulization, Q2H PRN, Rodolph Bong, MD   aspirin EC tablet 81 mg, 81 mg, Oral, Daily, Rodolph Bong, MD, 81 mg at Jan 28, 2023 1739   [START ON 01/28/2023] clopidogrel (PLAVIX) tablet 75 mg, 75 mg, Oral, Daily, Rodolph Bong, MD   enoxaparin (LOVENOX) injection 40 mg, 40 mg, Subcutaneous, Q24H, Rodolph Bong, MD, 40 mg at 2023/01/28 1739   hydrALAZINE (APRESOLINE) injection 10 mg, 10 mg, Intravenous, Q6H PRN, Rodolph Bong, MD   magnesium citrate solution 1 Bottle, 1 Bottle, Oral, Once PRN, Rodolph Bong, MD   ondansetron [facility]) tablet 4 mg, 4 mg, Oral, Q6H PRN **OR** ondansetron (ZOFRAN) injection 4 mg, 4 mg, Intravenous, Q6H PRN, Rodolph Bong, MD   oxyCODONE (Oxy IR/ROXICODONE) immediate release tablet 5 mg, 5 mg, Oral, Q4H PRN, Rodolph Bong, MD   senna-docusate (Senokot-S) tablet 1 tablet, 1 tablet, Oral, QHS PRN, Rodolph Bong, MD   sorbitol 70 % solution 30 mL, 30 mL, Oral, Daily PRN, Rodolph Bong, MD  Current Outpatient Medications:    ibuprofen (ADVIL) 200 MG tablet, Take 200 mg by mouth 2 (two) times daily as needed for headache., Disp: , Rfl:    MELATONIN PO, Take 1 tablet by mouth at bedtime as needed (sleep)., Disp: , Rfl:   Vitals   Vitals:   January 28, 2023 1407 01/28/2023 1430 Jan 28, 2023 1700 01/28/23 1900  BP:  (!) 144/98 (!) 162/102 (!) 145/102  Pulse:  72 79 83  Resp:  13 18 17   Temp: 99.1 F (37.3 C)   98 F (36.7 C)  TempSrc: Oral   Oral  SpO2:  99% 98% 99%  Weight:      Height:        Body mass index is 34.41 kg/m.  Physical Exam   Constitutional: Appears well-developed and well-nourished.  Neurologic Examination    Neuro: Mental Status: Patient is awake, alert, oriented to person, place, month, year, and situation. Patient is able to give a clear and coherent history. No signs of aphasia or neglect Cranial Nerves: II: Visual Fields are full. Pupils are equal, round, and reactive to light.   III,IV, VI: EOMI without ptosis or diploplia.  V: Facial sensation is symmetric to temperature VII: Facial movement is symmetric.  VIII: hearing is intact to voice X: Uvula elevates symmetrically XII: tongue is midline without atrophy or fasciculations.  Motor: Tone is normal. Bulk is normal. 5/5 strength was present on the right side, he has very subtle weakness of the left arm and leg with subtle drift Sensory: Sensation is symmetric to light touch and temperature in the arms and legs. Cerebellar: Intact finger-nose-finger bilaterally       Labs/Imaging/Neurodiagnostic studies   CBC:  Recent Labs  Lab 2023-01-28 0842  WBC 5.7  HGB 14.5  HCT 42.7  MCV 90.5  PLT 296   Basic Metabolic Panel:  Lab Results  Component Value Date   NA 138 01/28/23   K 3.7 January 28, 2023   CO2 25  2023-01-28   GLUCOSE 99 01-28-2023   BUN 13 01-28-23   CREATININE 1.19 2023/01/28   CALCIUM 9.1 01/28/2023   GFRNONAA >60 2023-01-28   GFRAA 96 09/27/2019   Lipid Panel:  Lab Results  Component Value Date   LDLCALC 105 (H) 09/27/2019   HgbA1c:  Lab Results  Component  Value Date   HGBA1C 4.9 09/27/2019   Urine Drug Screen: No results found for: "LABOPIA", "COCAINSCRNUR", "LABBENZ", "AMPHETMU", "THCU", "LABBARB"  Alcohol Level No results found for: "ETH" INR No results found for: "INR" APTT No results found for: "APTT" AED levels: No results found for: "PHENYTOIN", "ZONISAMIDE", "LAMOTRIGINE", "LEVETIRACETA"  MRI Brain(Personally reviewed): Small thalamic infarct  ASSESSMENT   REGIONAL HOVAN is a 48 y.o. male who presents with small vessel appearing thalamic infarct.  He will need to be evaluated for risk factors with secondary risk factor modification.  He will also need to be evaluated by physical therapy to help expedite his recovery.  RECOMMENDATIONS  - HgbA1c, fasting lipid panel - Frequent neuro checks - Echocardiogram - CTA head and neck - Prophylactic therapy-Antiplatelet med: Aspirin - dose 81mg  and plavix 75mg  daily  after 300mg  load  - Risk factor modification - Telemetry monitoring - PT consult, OT consult, Speech consult - Stroke team to follow  ______________________________________________________________________    Signed, Ritta Slot, MD [facility]

## 2023-01-27 NOTE — ED Notes (Signed)
Dinner tray ordered.

## 2023-01-27 NOTE — H&P (Signed)
History and Physical    Wayne Edwards WGN:[id number] DOB: Apr 14, 1975 DOA: 01/27/2023  PCP: Lezlie Lye, Meda Coffee, MD  Patient coming from: Home  I have personally briefly reviewed patient's old medical records in [facility]  Chief Complaint: Left-sided weakness  HPI: Wayne Edwards is a 48 y.o. male with no significant medical history presenting to the ED with a several history of left-sided weakness.  Patient states was in the [employer] in [place] around 10:30 PM the night prior to admission on 01/26/2023 when he noted some left-sided weakness with left lower extremity greater than left upper extremity with some associated numbness.  Patient denies any tingling.  Patient stated also had some gait instability and also had some tunnel vision for a while like he was going to fall over.  Patient stated initially also had some lightheadedness.  Patient subsequently drove home from [place] to [place] went to bed woke up the morning of admission with no significant improvement with his symptoms around 5:30 AM and subsequently presented to the ED.  Patient denies any facial droop, no speech impediments, no fever, no chills, no nausea, no vomiting, no chest pain, no shortness of breath, no abdominal pain, no diarrhea, no constipation, no melena, no hematemesis, no hematochezia.  Patient with no prior history of CVA.  Patient states grandmother had CVAs.  ED Course: Patient seen in the ED, on presentation vital signs stable.  CT head done with chronic appearing but technically age-indeterminate infarcts in the right caudate head and left thalamus.  MRI brain done with 8 mm acute infarct within the right thalamus.  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  Chronic lacunar infarcts within the left corona radiata, callosal body of the right, bilateral basal ganglia, left thalamus and right aspect of the pons.  Background mild cerebral white matter chronic small vessel ischemic disease.   Nonspecific chronic microhemorrhage within the mid right frontal lobe.  Paranasal sinus disease. -Neurology consulted, hospitalist called to admit patient.  Review of Systems: As per HPI otherwise all other systems reviewed and are negative.   History reviewed. No pertinent past medical history.  Past Surgical History:  Procedure Laterality Date   LITHOTRIPSY     VASECTOMY     WISDOM TOOTH EXTRACTION      Social History  reports that he has never smoked. He has never used smokeless tobacco. He reports current alcohol use. He reports that he does not use drugs.  Allergies  Allergen Reactions   Yellow Jacket Venom [Bee Venom] Anaphylaxis    Family History  Problem Relation Age of Onset   Healthy Mother    Healthy Father    Healthy Sister    Healthy Daughter    Healthy Son    Hyperlipidemia Maternal Grandmother    Healthy Maternal Grandfather    Stroke Paternal Grandmother    Healthy Paternal Grandfather    Mother alive in her late 47s healthy.  Father alive in his late 61s healthy.  Prior to Admission medications   Medication Sig Start Date End Date Taking? Authorizing Provider  ibuprofen (ADVIL) 200 MG tablet Take 200 mg by mouth 2 (two) times daily as needed for headache.   Yes [provider]  MELATONIN PO Take 1 tablet by mouth at bedtime as needed (sleep).   Yes [provider]    Physical Exam: Vitals:   01/27/23 0840 01/27/23 1405 01/27/23 1407 01/27/23 1430  BP:  (!) 188/117  (!) 144/98  Pulse:  71  72  Resp:  15  13  Temp:   99.1 F (37.3 C)   TempSrc:   Oral   SpO2:  100%  99%  Weight: 105.7 kg     Height: 5\' 9"  (1.753 m)       Constitutional: NAD, calm, comfortable Vitals:   01/27/23 0840 01/27/23 1405 01/27/23 1407 01/27/23 1430  BP:  (!) 188/117  (!) 144/98  Pulse:  71  72  Resp:  15  13  Temp:   99.1 F (37.3 C)   TempSrc:   Oral   SpO2:  100%  99%  Weight: 105.7 kg     Height: 5\' 9"  (1.753 m)      Eyes: PERRL, lids  and conjunctivae normal ENMT: Mucous membranes are moist.. Posterior pharynx clear of any exudate or lesions.Normal dentition.  Neck: normal, supple, no masses, no thyromegaly Respiratory: clear to auscultation bilaterally, no wheezing, no crackles. Normal respiratory effort. No accessory muscle use.  Cardiovascular: Regular rate and rhythm, no murmurs / rubs / gallops. No extremity edema. 2+ pedal pulses. No carotid bruits.  Abdomen: no tenderness, no masses palpated. No hepatosplenomegaly. Bowel sounds positive.  Musculoskeletal: no clubbing / cyanosis. No joint deformity upper and lower extremities. Good ROM, no contractures. Normal muscle tone.  Skin: no rashes, lesions, ulcers. No induration Neurologic: CN 2-12 grossly intact. Sensation intact, DTR normal. Strength 5/5 in all 4.  Gait not tested secondary to safety. Psychiatric: Normal judgment and insight. Alert and oriented x 3. Normal mood.   Labs on Admission: I have personally reviewed following labs and imaging studies  CBC: Recent Labs  Lab 01/27/23 0842  WBC 5.7  HGB 14.5  HCT 42.7  MCV 90.5  PLT 296    Basic Metabolic Panel: Recent Labs  Lab 01/27/23 0842  NA 138  K 3.7  CL 103  CO2 25  GLUCOSE 99  BUN 13  CREATININE 1.19  CALCIUM 9.1    GFR: Estimated Creatinine Clearance: 91.9 mL/min (by C-G formula based on SCr of 1.19 mg/dL).  Liver Function Tests: No results for input(s): "AST", "ALT", "ALKPHOS", "BILITOT", "PROT", "ALBUMIN" in the last 168 hours.  Urine analysis:    Component Value Date/Time   BILIRUBINUR negative 09/27/2019 1548   BILIRUBINUR neg 05/17/2012 1021   KETONESUR negative 09/27/2019 1548   PROTEINUR trace (A) 09/27/2019 1548   PROTEINUR 30 05/17/2012 1021   UROBILINOGEN 0.2 09/27/2019 1548   NITRITE Negative 09/27/2019 1548   NITRITE neg 05/17/2012 1021   LEUKOCYTESUR Negative 09/27/2019 1548    Radiological Exams on Admission: MR BRAIN WO CONTRAST Result Date:  01/27/2023 CLINICAL DATA:  Provided history: Transient ischemic attack (TIA). Additional history provided: Left-sided weakness, dizziness. EXAM: MRI HEAD WITHOUT CONTRAST TECHNIQUE: Multiplanar, multiecho pulse sequences of the brain and surrounding structures were obtained without intravenous contrast. COMPARISON:  Head CT 01/27/2023. FINDINGS: Brain: Cerebral volume within normal limits. 8 mm acute infarct within the right thalamus. 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 Chronic lacunar infarcts within the left corona radiata, callosal body on the right, bilateral basal ganglia, left thalamus and within the right aspect of the pons. Mild multifocal T2 FLAIR hyperintense signal abnormality elsewhere within the cerebral white matter, nonspecific but compatible with chronic small vessel ischemic disease. Nonspecific chronic microhemorrhage within the mid right frontal lobe. No evidence of an intracranial mass. No extra-axial fluid collection. No midline shift. Vascular: Maintained flow voids within the proximal large arterial vessels. Skull and upper cervical spine:  No focal recent marrow lesion. Sinuses/Orbits: No mass or acute finding within the imaged orbits. Mild mucosal thickening within the right maxillary sinus. 12 mm mucous retention cyst, and mild background mucosal thickening, within the left maxillary sinus. IMPRESSION: 1. 8 mm acute infarct within the right thalamus. 2. 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 3. Chronic lacunar infarcts within the left corona radiata, callosal body on the right, bilateral basal ganglia, left thalamus and right aspect of the pons. 4. Background mild cerebral white matter chronic small vessel ischemic disease. 5. Nonspecific chronic microhemorrhage within the mid right frontal lobe. 6. Paranasal sinus disease as described Electronically Signed   By: Jackey Loge D.O.   On: 01/27/2023 14:46   CT Head Wo Contrast Result Date:  01/27/2023 CLINICAL DATA:  Transient ischemic attack (TIA) transient weakness EXAM: CT HEAD WITHOUT CONTRAST TECHNIQUE: Contiguous axial images were obtained from the base of the skull through the vertex without intravenous contrast. RADIATION DOSE REDUCTION: This exam was performed according to the departmental dose-optimization program which includes automated exposure control, adjustment of the mA and/or kV according to patient size and/or use of iterative reconstruction technique. COMPARISON:  None Available. FINDINGS: Brain: No hemorrhage. No hydrocephalus. No extra-axial fluid collection. There are chronic appearing, but technically age indeterminate infarcts in the right caudate head and left thalamus. No mass effect. No mass lesion. Vascular: No hyperdense vessel or unexpected calcification. Skull: Normal. Negative for fracture or focal lesion. Sinuses/Orbits: No middle ear or mastoid effusion. Paranasal are clear. Orbits are unremarkable. Other: None. IMPRESSION: Chronic appearing, but technically age indeterminate infarcts in the right caudate head and left thalamus. If there is concern for an acute infarct, consider further evaluation with MRI. Electronically Signed   By: Lorenza Cambridge M.D.   On: 01/27/2023 11:44    EKG: Independently reviewed. NSR  Assessment/Plan Principal Problem:   CVA (cerebral vascular accident) (HCC) Active Problems:   Elevated blood pressure reading   #1 acute CVA -Patient presented with left-sided weakness lower extremity greater than upper extremity with some numbness but no tingling. -Patient with no speech abnormalities, no facial asymmetry. -CT head negative for any acute abnormalities. -MRI brain done with 8 mm acute infarct in the right thalamus. -MRI also with some chronic infarcts noted in the right basal ganglia, left corona radiata, callosal body on the right, bilateral basal ganglia, left thalamus and right aspect of the pons.  Some chronic small  vessel ischemic disease also noted. -Will admit patient for stroke workup. -Check a CT angiogram head and neck, 2D echo, fasting lipid panel, hemoglobin A1c. -Permissive hypertension to keep blood pressure < 220/120. -Placed on aspirin 81 mg daily, Plavix load 300 mg x 1 and then Plavix 75 mg daily. -PT/OT/SLP. -Case discussed with neurology. -Neurology consulted and appreciate their input and recommendations.  2.  Elevated blood pressure reading -Patient with no prior history of hypertension. -Permissive hypertension. -IV hydralazine as needed systolic blood pressure > 220. -Supportive care.   DVT prophylaxis: Lovenox Code Status:   Full Family Communication: Updated patient.  No family at bedside. Disposition Plan:   Patient is from:  Home  Anticipated DC to:  Home  Anticipated DC date:  1 to 2 days  Anticipated DC barriers: None  Consults called:  Neurology Admission status:  Admit to inpatient  Severity of Illness: The appropriate patient status for this patient is INPATIENT. Inpatient status is judged to be reasonable and necessary in order to provide the required intensity of  service to ensure the patient's safety. The patient's presenting symptoms, physical exam findings, and initial radiographic and laboratory data in the context of their chronic comorbidities is felt to place them at high risk for further clinical deterioration. Furthermore, it is not anticipated that the patient will be medically stable for discharge from the hospital within 2 midnights of admission.   * I certify that at the point of admission it is my clinical judgment that the patient will require inpatient hospital care spanning beyond 2 midnights from the point of admission due to high intensity of service, high risk for further deterioration and high frequency of surveillance required.*    Ramiro Harvest MD [facility]  How to contact the [facility] Attending or Consulting provider 7A - 7P or  covering provider during after hours 7P -7A, for this patient?   Check the care team in [facility] and look for a) attending/consulting [facility] provider listed and b) the [facility] team listed Log into [email or web address] and use [facility]'s universal password to access. If you do not have the password, please contact the hospital operator. Locate the [facility] provider you are looking for under [facility] and page to a number that you can be directly reached. If you still have difficulty reaching the provider, please page the [facility] (Director on Call) for the Hospitalists listed on amion for assistance.  01/27/2023, 5:20 PM

## 2023-01-27 NOTE — ED Notes (Signed)
Pt to CT

## 2023-01-27 NOTE — ED Notes (Signed)
Coming to room from MRI

## 2023-01-27 NOTE — ED Provider Notes (Signed)
[facility] Provider Note   CSN: [id number] Arrival date & time: 01/27/23  0820     History  Chief Complaint  Patient presents with   Weakness    Wayne Edwards is a 48 y.o. male.  HPI Presents with his wife who assist with the history. He presents with concern of onset of left-sided weakness yesterday, about 18 hours ago.  There is sense of lightheadedness, with left-sided weakness in the left leg, greater than the left arm.  Symptoms improved substantially over the night, but ongoing paresthesia in the left side presents for evaluation.    Home Medications Prior to Admission medications   Not on File      Allergies    Yellow jacket venom [bee venom]    Review of Systems   Review of Systems  Physical Exam Updated Vital Signs BP (!) 144/98   Pulse 72   Temp 99.1 F (37.3 C) (Oral)   Resp 13   Ht 5\' 9"  (1.753 m)   Wt 105.7 kg   SpO2 99%   BMI 34.41 kg/m  Physical Exam Vitals and nursing note reviewed.  Constitutional:      General: He is not in acute distress.    Appearance: He is well-developed.  HENT:     Head: Normocephalic and atraumatic.  Eyes:     Conjunctiva/sclera: Conjunctivae normal.  Cardiovascular:     Rate and Rhythm: Normal rate and regular rhythm.  Pulmonary:     Effort: Pulmonary effort is normal. No respiratory distress.     Breath sounds: No stridor.  Abdominal:     General: There is no distension.  Skin:    General: Skin is warm and dry.  Neurological:     General: No focal deficit present.     Mental Status: He is alert and oriented to person, place, and time.     Motor: No weakness.     Coordination: Coordination normal.  Psychiatric:        Mood and Affect: Mood normal.        Behavior: Behavior normal.     ED Results / Procedures / Treatments   Labs (all labs ordered are listed, but only abnormal results are displayed) Labs Reviewed  BASIC METABOLIC PANEL  CBC   URINALYSIS, ROUTINE W REFLEX MICROSCOPIC  CBG MONITORING, ED    EKG EKG Interpretation Date/Time:  Friday January 27 2023 08:35:16 EST Ventricular Rate:  74 PR Interval:  118 QRS Duration:  84 QT Interval:  360 QTC Calculation: 399 R Axis:   14  Text Interpretation: Normal sinus rhythm Normal ECG No previous ECGs available Confirmed by Gerhard Munch [phone]) on 01/27/2023 2:10:56 PM  Radiology MR BRAIN WO CONTRAST Result Date: 01/27/2023 CLINICAL DATA:  Provided history: Transient ischemic attack (TIA). Additional history provided: Left-sided weakness, dizziness. EXAM: MRI HEAD WITHOUT CONTRAST TECHNIQUE: Multiplanar, multiecho pulse sequences of the brain and surrounding structures were obtained without intravenous contrast. COMPARISON:  Head CT 01/27/2023. FINDINGS: Brain: Cerebral volume within normal limits. 8 mm acute infarct within the right thalamus. 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 Chronic lacunar infarcts within the left corona radiata, callosal body on the right, bilateral basal ganglia, left thalamus and within the right aspect of the pons. Mild multifocal T2 FLAIR hyperintense signal abnormality elsewhere within the cerebral white matter, nonspecific but compatible with chronic small vessel ischemic disease. Nonspecific chronic microhemorrhage within the mid right frontal lobe. No evidence of an intracranial  mass. No extra-axial fluid collection. No midline shift. Vascular: Maintained flow voids within the proximal large arterial vessels. Skull and upper cervical spine: No focal recent marrow lesion. Sinuses/Orbits: No mass or acute finding within the imaged orbits. Mild mucosal thickening within the right maxillary sinus. 12 mm mucous retention cyst, and mild background mucosal thickening, within the left maxillary sinus. IMPRESSION: 1. 8 mm acute infarct within the right thalamus. 2. 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 3. Chronic  lacunar infarcts within the left corona radiata, callosal body on the right, bilateral basal ganglia, left thalamus and right aspect of the pons. 4. Background mild cerebral white matter chronic small vessel ischemic disease. 5. Nonspecific chronic microhemorrhage within the mid right frontal lobe. 6. Paranasal sinus disease as described Electronically Signed   By: Jackey Loge D.O.   On: 01/27/2023 14:46   CT Head Wo Contrast Result Date: 01/27/2023 CLINICAL DATA:  Transient ischemic attack (TIA) transient weakness EXAM: CT HEAD WITHOUT CONTRAST TECHNIQUE: Contiguous axial images were obtained from the base of the skull through the vertex without intravenous contrast. RADIATION DOSE REDUCTION: This exam was performed according to the departmental dose-optimization program which includes automated exposure control, adjustment of the mA and/or kV according to patient size and/or use of iterative reconstruction technique. COMPARISON:  None Available. FINDINGS: Brain: No hemorrhage. No hydrocephalus. No extra-axial fluid collection. There are chronic appearing, but technically age indeterminate infarcts in the right caudate head and left thalamus. No mass effect. No mass lesion. Vascular: No hyperdense vessel or unexpected calcification. Skull: Normal. Negative for fracture or focal lesion. Sinuses/Orbits: No middle ear or mastoid effusion. Paranasal are clear. Orbits are unremarkable. Other: None. IMPRESSION: Chronic appearing, but technically age indeterminate infarcts in the right caudate head and left thalamus. If there is concern for an acute infarct, consider further evaluation with MRI. Electronically Signed   By: Lorenza Cambridge M.D.   On: 01/27/2023 11:44    Procedures Procedures    Medications Ordered in ED Medications - No data to display  ED Course/ Medical Decision Making/ A&P                                 Medical Decision Making Adult male presents with left-sided paresthesia, weakness,  lightheadedness began yesterday suddenly.  Patient is awake and alert, notes that his symptoms have improved markedly, though not resolved entirely.  Concern for stroke versus TIA versus hypertensive urgency given diastolic over 100 on arrival.  Patient does not take blood pressure medication, has no history of prior stroke. Cardiac 70 sinus normal line pulse ox 100% room air normal  Amount and/or Complexity of Data Reviewed Independent Historian: spouse Labs: ordered. Decision-making details documented in ED Course. Radiology: independent interpretation performed. Decision-making details documented in ED Course. ECG/medicine tests: independent interpretation performed. Decision-making details documented in ED Course.  Risk Prescription drug management. Decision regarding hospitalization.  I reviewed the CT images with the patient and his wife at bedside. 3:27 PM MRI with evidence for thalamic stroke, acute with multiple other prior episodes.  Patient remains in reassuring neurologic condition.  I have discussed his case with our internal medicine colleagues, and neurology for admission due to evidence for acute thalamic infarct.        Final Clinical Impression(s) / ED Diagnoses Final diagnoses:  Acute CVA (cerebrovascular accident) [facility])     Gerhard Munch, MD 01/27/23 1600

## 2023-01-27 NOTE — ED Notes (Signed)
PT at bedside.

## 2023-01-27 NOTE — ED Triage Notes (Signed)
Pt arrives via POV from home with left sided weakness that began at 10:12 pm last night. PT states was having some dizziness as well as left arm and leg weakness at the time. Went to bed and woke up with feelings of continued weakness, now states not as severe as initially. No droop, drift, slurred speech noted at this time.

## 2023-01-28 ENCOUNTER — Inpatient Hospital Stay (HOSPITAL_COMMUNITY): Payer: 59

## 2023-01-28 DIAGNOSIS — I639 Cerebral infarction, unspecified: Secondary | ICD-10-CM | POA: Diagnosis not present

## 2023-01-28 DIAGNOSIS — I6389 Other cerebral infarction: Secondary | ICD-10-CM | POA: Diagnosis not present

## 2023-01-28 DIAGNOSIS — I63331 Cerebral infarction due to thrombosis of right posterior cerebral artery: Secondary | ICD-10-CM | POA: Diagnosis not present

## 2023-01-28 LAB — BASIC METABOLIC PANEL
Anion gap: 7 (ref 5–15)
BUN: 15 mg/dL (ref 6–20)
CO2: 22 mmol/L (ref 22–32)
Calcium: 8.5 mg/dL — ABNORMAL LOW (ref 8.9–10.3)
Chloride: 106 mmol/L (ref 98–111)
Creatinine, Ser: 1.09 mg/dL (ref 0.61–1.24)
GFR, Estimated: >60 mL/min (ref >60)
Glucose, Bld: 92 mg/dL (ref 70–99)
Potassium: 3.8 mmol/L (ref 3.5–5.1)
Sodium: 135 mmol/L (ref 135–145)

## 2023-01-28 LAB — LIPID PANEL
Cholesterol: 167 mg/dL (ref 0–200)
HDL: 35 mg/dL — ABNORMAL LOW (ref >40)
LDL Cholesterol: 100 mg/dL — ABNORMAL HIGH (ref 0–99)
Total CHOL/HDL Ratio: 4.8 {ratio}
Triglycerides: 160 mg/dL — ABNORMAL HIGH (ref <150)
VLDL: 32 mg/dL (ref 0–40)

## 2023-01-28 LAB — CBC
HCT: 40.8 % (ref 39.0–52.0)
Hemoglobin: 14.3 g/dL (ref 13.0–17.0)
MCH: 31 pg (ref 26.0–34.0)
MCHC: 35 g/dL (ref 30.0–36.0)
MCV: 88.5 fL (ref 80.0–100.0)
Platelets: 276 10*3/uL (ref 150–400)
RBC: 4.61 MIL/uL (ref 4.22–5.81)
RDW: 12.4 % (ref 11.5–15.5)
WBC: 5.7 10*3/uL (ref 4.0–10.5)
nRBC: 0 % (ref 0.0–0.2)

## 2023-01-28 LAB — RAPID URINE DRUG SCREEN, HOSP PERFORMED
Amphetamines: NOT DETECTED
Barbiturates: NOT DETECTED
Benzodiazepines: NOT DETECTED
Cocaine: NOT DETECTED
Opiates: NOT DETECTED
Tetrahydrocannabinol: NOT DETECTED

## 2023-01-28 LAB — ECHOCARDIOGRAM COMPLETE
AR max vel: 3.2 cm2
AV Area VTI: 2.76 cm2
AV Area mean vel: 2.34 cm2
AV Mean grad: 2 mm[Hg]
AV Peak grad: 3.4 mm[Hg]
Ao pk vel: 0.92 m/s
Area-P 1/2: 3.63 cm2
Calc EF: 48.8 %
Height: 69 in
MV VTI: 2.32 cm2
S' Lateral: 3.4 cm
Single Plane A2C EF: 48.6 %
Single Plane A4C EF: 49.2 %
Weight: 3742.53 [oz_av]

## 2023-01-28 LAB — URINALYSIS, ROUTINE W REFLEX MICROSCOPIC
Bacteria, UA: NONE SEEN
Bilirubin Urine: NEGATIVE
Glucose, UA: NEGATIVE mg/dL
Ketones, ur: NEGATIVE mg/dL
Leukocytes,Ua: NEGATIVE
Nitrite: NEGATIVE
Protein, ur: NEGATIVE mg/dL
Specific Gravity, Urine: 1.024 (ref 1.005–1.030)
pH: 7 (ref 5.0–8.0)

## 2023-01-28 LAB — HEMOGLOBIN A1C
Hgb A1c MFr Bld: 4.9 % (ref 4.8–5.6)
Mean Plasma Glucose: 93.93 mg/dL

## 2023-01-28 LAB — MAGNESIUM: Magnesium: 1.9 mg/dL (ref 1.7–2.4)

## 2023-01-28 MED ORDER — ATORVASTATIN CALCIUM 40 MG PO TABS
40.0000 mg | ORAL_TABLET | Freq: Every day | ORAL | Status: DC
  Administered 2023-01-28 – 2023-01-29 (×2): 40 mg via ORAL
  Filled 2023-01-28 (×2): qty 1

## 2023-01-28 NOTE — Plan of Care (Signed)

## 2023-01-28 NOTE — Evaluation (Signed)
Physical Therapy Evaluation & Discharge Patient Details Name: Wayne Edwards MRN: [id number] DOB: Jan 15, 1975 Today's Date: 01/28/2023  History of Present Illness  48 y.o. male admitted 01/27/23 with c/o L-side numbness. MRI showed small acute infarct R thalamus; chronic R basal ganglia infarct; chronic lacunar infarcts. No significant PMH on file.   Clinical Impression  Patient evaluated by Physical Therapy with no further acute PT needs identified. PTA, pt independent, works as Tourist information centre manager, lives with family. Today, pt independent with mobility and higher level balance tasks. Pt reports significant improvement in LLE sensation. All education has been completed and the patient has no further questions. Acute PT is signing off. Thank you for this referral.      If plan is discharge home, recommend the following: Assist for transportation   Can travel by private vehicle    Yes    Equipment Recommendations None recommended by PT  Recommendations for Other Services       Functional Status Assessment Patient has not had a recent decline in their functional status     Precautions / Restrictions Precautions Precautions: None Restrictions Weight Bearing Restrictions Per Provider Order: No      Mobility  Bed Mobility Overal bed mobility: Independent                  Transfers Overall transfer level: Independent Equipment used: None                    Ambulation/Gait Ambulation/Gait assistance: Independent Gait Distance (Feet): 400 Feet Assistive device: None Gait Pattern/deviations: WFL(Within Functional Limits)       General Gait Details: gait WFL; reports L anterior thigh feels slightly "different" but LE no longer feels heavy  Stairs            Wheelchair Mobility     Tilt Bed    Modified Rankin (Stroke Patients Only)       Balance Overall balance assessment: Independent                                Standardized Balance Assessment Standardized Balance Assessment : Dynamic Gait Index   Dynamic Gait Index Level Surface: Normal Change in Gait Speed: Normal Gait with Horizontal Head Turns: Normal Gait with Vertical Head Turns: Normal Gait and Pivot Turn: Normal Step Over Obstacle: Normal Step Around Obstacles: Normal Steps: Normal Total Score: 24       Pertinent Vitals/Pain      Home Living Family/patient expects to be discharged to:: Private residence Living Arrangements: Spouse/significant other Available Help at Discharge: Family;Available 24 hours/day Type of Home: House Home Access: Stairs to enter Entrance Stairs-Rails: None Entrance Stairs-Number of Steps: 3 Alternate Level Stairs-Number of Steps: full flight Home Layout: Two level;Bed/bath upstairs Home Equipment: None      Prior Function Prior Level of Function : Independent/Modified Independent;Working/employed;Driving             Mobility Comments: independent without DME. 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 reports walking for exercises, does not strength train ADLs Comments: independent     Extremity/Trunk Assessment   Upper Extremity Assessment Upper Extremity Assessment: Overall WFL for tasks assessed    Lower Extremity Assessment Lower Extremity Assessment: Overall WFL for tasks assessed (slight L knee extensor weakness noted)       Communication   Communication Communication: No apparent difficulties  Cognition Arousal: Alert Behavior During Therapy: WFL for tasks assessed/performed  Overall Cognitive Status: Within Functional Limits for tasks assessed                                          General Comments General comments (skin integrity, edema, etc.): educ re: POC, activity recommendations, importance of mobility. HR 127    Exercises     Assessment/Plan    PT Assessment Patient does not need any further PT services  PT Problem List          PT Treatment Interventions      PT Goals (Current goals can be found in the Care Plan section)  Acute Rehab PT Goals PT Goal Formulation: All assessment and education complete, DC therapy    Frequency       Co-evaluation               AM-PAC PT "6 Clicks" Mobility  Outcome Measure Help needed turning from your back to your side while in a flat bed without using bedrails?: None Help needed moving from lying on your back to sitting on the side of a flat bed without using bedrails?: None Help needed moving to and from a bed to a chair (including a wheelchair)?: None Help needed standing up from a chair using your arms (e.g., wheelchair or bedside chair)?: None Help needed to walk in hospital room?: None Help needed climbing 3-5 steps with a railing? : None 6 Click Score: 24    End of Session   Activity Tolerance: Patient tolerated treatment well Patient left: in bed;with call bell/phone within reach Nurse Communication: Mobility status PT Visit Diagnosis: Other abnormalities of gait and mobility (R26.89)    Time: 4401-0272 PT Time Calculation (min) (ACUTE ONLY): 11 min   Charges:   PT Evaluation $PT Eval Low Complexity: 1 Low   PT General Charges $$ ACUTE PT VISIT: 1 Visit       Ina Homes, PT, DPT Acute Rehabilitation Services  Personal: Secure Chat Rehab Office: [phone]  Malachy Chamber 01/28/2023, 1:00 PM

## 2023-01-28 NOTE — Plan of Care (Signed)
  Problem: Ischemic Stroke/TIA Tissue Perfusion: Goal: Complications of ischemic stroke/TIA will be minimized Outcome: Progressing   Problem: Health Behavior/Discharge Planning: Goal: Ability to manage health-related needs will improve Outcome: Progressing   Problem: Education: Goal: Knowledge of General Education information will improve Description: Including pain rating scale, medication(s)/side effects and non-pharmacologic comfort measures Outcome: Progressing   Problem: Health Behavior/Discharge Planning: Goal: Ability to manage health-related needs will improve Outcome: Progressing   Problem: Clinical Measurements: Goal: Ability to maintain clinical measurements within normal limits will improve Outcome: Progressing   Problem: Coping: Goal: Level of anxiety will decrease Outcome: Progressing

## 2023-01-28 NOTE — Progress Notes (Signed)
STROKE TEAM PROGRESS NOTE   SUBJECTIVE (INTERVAL HISTORY) No family is at the bedside.  Overall his condition is rapidly improving.  He still felt some numbness and heaviness on the left, but not significant on exam.  CT and MRI showed multiple remote lacunar infarcts beside current right thalamic infarct.   OBJECTIVE Temp:  [97.6 F (36.4 C)-98 F (36.7 C)] 97.9 F (36.6 C) (01/18 0437) Pulse Rate:  [62-86] 62 (01/18 0437) Cardiac Rhythm: Normal sinus rhythm (01/18 0847) Resp:  [12-20] 17 (01/18 0437) BP: (143-175)/(98-112) 175/110 (01/18 0437) SpO2:  [95 %-99 %] 96 % (01/18 0437) Weight:  [106.1 kg] 106.1 kg (01/18 0437)  Recent Labs  Lab 01/27/23 0845  GLUCAP 97   Recent Labs  Lab 01/27/23 0842 01/28/23 0536  NA 138 135  K 3.7 3.8  CL 103 106  CO2 25 22  GLUCOSE 99 92  BUN 13 15  CREATININE 1.19 1.09  CALCIUM 9.1 8.5*  MG  --  1.9   No results for input(s): "AST", "ALT", "ALKPHOS", "BILITOT", "PROT", "ALBUMIN" in the last 168 hours. Recent Labs  Lab 01/27/23 0842 01/28/23 0536  WBC 5.7 5.7  HGB 14.5 14.3  HCT 42.7 40.8  MCV 90.5 88.5  PLT 296 276   No results for input(s): "CKTOTAL", "CKMB", "CKMBINDEX", "TROPONINI" in the last 168 hours. No results for input(s): "LABPROT", "INR" in the last 72 hours. Recent Labs    01/27/23 2314  COLORURINE YELLOW  LABSPEC 1.024  PHURINE 7.0  GLUCOSEU NEGATIVE  HGBUR SMALL*  BILIRUBINUR NEGATIVE  KETONESUR NEGATIVE  PROTEINUR NEGATIVE  NITRITE NEGATIVE  LEUKOCYTESUR NEGATIVE       Component Value Date/Time   CHOL 167 01/28/2023 0536   CHOL 171 09/27/2019 1620   TRIG 160 (H) 01/28/2023 0536   HDL 35 (L) 01/28/2023 0536   HDL 49 09/27/2019 1620   CHOLHDL 4.8 01/28/2023 0536   VLDL 32 01/28/2023 0536   LDLCALC 100 (H) 01/28/2023 0536   LDLCALC 105 (H) 09/27/2019 1620   Lab Results  Component Value Date   HGBA1C 4.9 01/28/2023      Component Value Date/Time   LABOPIA NONE DETECTED 01/27/2023 2314    COCAINSCRNUR NONE DETECTED 01/27/2023 2314   LABBENZ NONE DETECTED 01/27/2023 2314   AMPHETMU NONE DETECTED 01/27/2023 2314   THCU NONE DETECTED 01/27/2023 2314   LABBARB NONE DETECTED 01/27/2023 2314    No results for input(s): "ETH" in the last 168 hours.  I have personally reviewed the radiological images below and agree with the radiology interpretations.  ECHOCARDIOGRAM COMPLETE Result Date: 01/28/2023    ECHOCARDIOGRAM REPORT   Patient Name:   Jerod BASILE CDEBACA Date of Exam: 01/28/2023 Medical Rec #:  [id number]      Height:       69.0 in Accession #:    [id number]     Weight:       233.9 lb Date of Birth:  10/17/1975      BSA:          2.208 m Patient Age:    48 years       BP:           175/110 mmHg Patient Gender: M              HR:           70 bpm. Exam Location:  Inpatient Procedure: 2D Echo, Cardiac Doppler and Color Doppler Indications:    Stroke  History:  Patient has no prior history of Echocardiogram examinations.                 Risk Factors:Non-Smoker.  Sonographer:    Vern Claude Referring Phys: [id number] DANIEL V THOMPSON IMPRESSIONS  1. Left ventricular ejection fraction, by estimation, is 55 to 60%. The left ventricle has normal function. The left ventricle has no regional wall motion abnormalities. Left ventricular diastolic parameters were normal.  2. Right ventricular systolic function is normal. The right ventricular size is normal.  3. The mitral valve is normal in structure. Trivial mitral valve regurgitation. No evidence of mitral stenosis.  4. The aortic valve is tricuspid. Aortic valve regurgitation is not visualized. No aortic stenosis is present.  5. The inferior vena cava is normal in size with greater than 50% respiratory variability, suggesting right atrial pressure of 3 mmHg. Comparison(s): No prior Echocardiogram. FINDINGS  Left Ventricle: Left ventricular ejection fraction, by estimation, is 55 to 60%. The left ventricle has normal function. The left ventricle has no  regional wall motion abnormalities. The left ventricular internal cavity size was normal in size. There is  no left ventricular hypertrophy. Left ventricular diastolic parameters were normal. Right Ventricle: The right ventricular size is normal. Right ventricular systolic function is normal. Left Atrium: Left atrial size was normal in size. Right Atrium: Right atrial size was normal in size. Pericardium: There is no evidence of pericardial effusion. Mitral Valve: The mitral valve is normal in structure. Trivial mitral valve regurgitation. No evidence of mitral valve stenosis. MV peak gradient, 3.0 mmHg. The mean mitral valve gradient is 2.0 mmHg. Tricuspid Valve: The tricuspid valve is normal in structure. Tricuspid valve regurgitation is trivial. No evidence of tricuspid stenosis. Aortic Valve: The aortic valve is tricuspid. Aortic valve regurgitation is not visualized. No aortic stenosis is present. Aortic valve mean gradient measures 2.0 mmHg. Aortic valve peak gradient measures 3.4 mmHg. Aortic valve area, by VTI measures 2.76 cm. Pulmonic Valve: The pulmonic valve was not well visualized. Pulmonic valve regurgitation is not visualized. No evidence of pulmonic stenosis. Aorta: The aortic root is normal in size and structure. Venous: The inferior vena cava is normal in size with greater than 50% respiratory variability, suggesting right atrial pressure of 3 mmHg. IAS/Shunts: No atrial level shunt detected by color flow Doppler.  LEFT VENTRICLE PLAX 2D LVIDd:         4.70 cm      Diastology LVIDs:         3.40 cm      LV e' medial:    11.40 cm/s LV PW:         1.00 cm      LV E/e' medial:  5.8 LV IVS:        0.80 cm      LV e' lateral:   10.90 cm/s LVOT diam:     2.10 cm      LV E/e' lateral: 6.1 LV SV:         50 LV SV Index:   23 LVOT Area:     3.46 cm  LV Volumes (MOD) LV vol d, MOD A2C: 159.0 ml LV vol d, MOD A4C: 150.0 ml LV vol s, MOD A2C: 81.8 ml LV vol s, MOD A4C: 76.2 ml LV SV MOD A2C:     77.2 ml LV  SV MOD A4C:     150.0 ml LV SV MOD BP:      75.1 ml RIGHT VENTRICLE  IVC RV Mid diam:    3.20 cm     IVC diam: 1.60 cm RV S prime:     11.60 cm/s LEFT ATRIUM             Index        RIGHT ATRIUM           Index LA diam:        2.90 cm 1.31 cm/m   RA Area:     12.40 cm LA Vol (A2C):   32.9 ml 14.90 ml/m  RA Volume:   30.80 ml  13.95 ml/m LA Vol (A4C):   28.0 ml 12.68 ml/m LA Biplane Vol: 31.2 ml 14.13 ml/m  AORTIC VALVE                    PULMONIC VALVE AV Area (Vmax):    3.20 cm     PV Vmax:       0.79 m/s AV Area (Vmean):   2.34 cm     PV Peak grad:  2.5 mmHg AV Area (VTI):     2.76 cm AV Vmax:           91.70 cm/s AV Vmean:          66.200 cm/s AV VTI:            0.181 m AV Peak Grad:      3.4 mmHg AV Mean Grad:      2.0 mmHg LVOT Vmax:         84.60 cm/s LVOT Vmean:        44.800 cm/s LVOT VTI:          0.144 m LVOT/AV VTI ratio: 0.80  AORTA Ao Root diam: 3.50 cm Ao Asc diam:  3.50 cm MITRAL VALVE MV Area (PHT): 3.63 cm    SHUNTS MV Area VTI:   2.32 cm    Systemic VTI:  0.14 m MV Peak grad:  3.0 mmHg    Systemic Diam: 2.10 cm MV Mean grad:  2.0 mmHg MV Vmax:       0.86 m/s MV Vmean:      59.0 cm/s MV Decel Time: 209 msec MV E velocity: 66.00 cm/s MV A velocity: 85.90 cm/s MV E/A ratio:  0.77 Olga Millers MD Electronically signed by Olga Millers MD Signature Date/Time: 01/28/2023/2:36:11 PM    Final    CT ANGIO HEAD NECK W WO CM Result Date: 01/27/2023 CLINICAL DATA:  Stroke on same-day MRI head, assess for intracardiac shunt EXAM: CT ANGIOGRAPHY HEAD AND NECK WITH AND WITHOUT CONTRAST TECHNIQUE: Multidetector CT imaging of the head and neck was performed using the standard protocol during bolus administration of intravenous contrast. Multiplanar CT image reconstructions and MIPs were obtained to evaluate the vascular anatomy. Carotid stenosis measurements (when applicable) are obtained utilizing NASCET criteria, using the distal internal carotid diameter as the denominator. RADIATION  DOSE REDUCTION: This exam was performed according to the departmental dose-optimization program which includes automated exposure control, adjustment of the mA and/or kV according to patient size and/or use of iterative reconstruction technique. CONTRAST:  75mL OMNIPAQUE IOHEXOL 350 MG/ML SOLN COMPARISON:  01/27/2023 MRI head, 01/27/2023 CT head, no prior CTA available FINDINGS: CT HEAD FINDINGS For noncontrast findings, please see same day CT head. CTA NECK FINDINGS Aortic arch: Standard branching. Imaged portion shows no evidence of aneurysm or dissection. No significant stenosis of the major arch vessel origins. Right carotid system: No evidence of dissection, occlusion, or hemodynamically significant stenosis (  greater than 50%). Left carotid system: No evidence of dissection, occlusion, or hemodynamically significant stenosis (greater than 50%). Vertebral arteries: No evidence of dissection, occlusion, or hemodynamically significant stenosis (greater than 50%). Skeleton: No acute osseous abnormality. Other neck: No acute finding. Upper chest: No focal pulmonary opacity or pleural effusion. Review of the MIP images confirms the above findings CTA HEAD FINDINGS Anterior circulation: Both internal carotid arteries are patent to the termini, with calcifications but without significant stenosis. A1 segments patent. Normal anterior communicating artery. Anterior cerebral arteries are patent to their distal aspects without significant stenosis. No M1 stenosis or occlusion. MCA branches perfused to their distal aspects without significant stenosis. Posterior circulation: Vertebral arteries patent to the vertebrobasilar junction without significant stenosis. Posterior inferior cerebellar arteries patent proximally. Basilar patent to its distal aspect without significant stenosis. Superior cerebellar arteries patent proximally. Patent P1 segments. PCAs perfused to their distal aspects without significant stenosis. The  bilateral posterior communicating arteries are not visualized. Venous sinuses: As permitted by contrast timing, patent. Anatomic variants: None significant. No evidence of aneurysm or vascular malformation. Review of the MIP images confirms the above findings IMPRESSION: 1. No intracranial large vessel occlusion or significant stenosis. 2. No hemodynamically significant stenosis in the neck. 3. No evidence of aneurysm or vascular malformation. Electronically Signed   By: Wiliam Ke M.D.   On: 01/27/2023 18:48   MR BRAIN WO CONTRAST Result Date: 01/27/2023 CLINICAL DATA:  Provided history: Transient ischemic attack (TIA). Additional history provided: Left-sided weakness, dizziness. EXAM: MRI HEAD WITHOUT CONTRAST TECHNIQUE: Multiplanar, multiecho pulse sequences of the brain and surrounding structures were obtained without intravenous contrast. COMPARISON:  Head CT 01/27/2023. FINDINGS: Brain: Cerebral volume within normal limits. 8 mm acute infarct within the right thalamus. 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 Chronic lacunar infarcts within the left corona radiata, callosal body on the right, bilateral basal ganglia, left thalamus and within the right aspect of the pons. Mild multifocal T2 FLAIR hyperintense signal abnormality elsewhere within the cerebral white matter, nonspecific but compatible with chronic small vessel ischemic disease. Nonspecific chronic microhemorrhage within the mid right frontal lobe. No evidence of an intracranial mass. No extra-axial fluid collection. No midline shift. Vascular: Maintained flow voids within the proximal large arterial vessels. Skull and upper cervical spine: No focal recent marrow lesion. Sinuses/Orbits: No mass or acute finding within the imaged orbits. Mild mucosal thickening within the right maxillary sinus. 12 mm mucous retention cyst, and mild background mucosal thickening, within the left maxillary sinus. IMPRESSION: 1. 8 mm acute  infarct within the right thalamus. 2. 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 3. Chronic lacunar infarcts within the left corona radiata, callosal body on the right, bilateral basal ganglia, left thalamus and right aspect of the pons. 4. Background mild cerebral white matter chronic small vessel ischemic disease. 5. Nonspecific chronic microhemorrhage within the mid right frontal lobe. 6. Paranasal sinus disease as described Electronically Signed   By: Jackey Loge D.O.   On: 01/27/2023 14:46   CT Head Wo Contrast Result Date: 01/27/2023 CLINICAL DATA:  Transient ischemic attack (TIA) transient weakness EXAM: CT HEAD WITHOUT CONTRAST TECHNIQUE: Contiguous axial images were obtained from the base of the skull through the vertex without intravenous contrast. RADIATION DOSE REDUCTION: This exam was performed according to the departmental dose-optimization program which includes automated exposure control, adjustment of the mA and/or kV according to patient size and/or use of iterative reconstruction technique. COMPARISON:  None Available. FINDINGS: Brain: No hemorrhage.  No hydrocephalus. No extra-axial fluid collection. There are chronic appearing, but technically age indeterminate infarcts in the right caudate head and left thalamus. No mass effect. No mass lesion. Vascular: No hyperdense vessel or unexpected calcification. Skull: Normal. Negative for fracture or focal lesion. Sinuses/Orbits: No middle ear or mastoid effusion. Paranasal are clear. Orbits are unremarkable. Other: None. IMPRESSION: Chronic appearing, but technically age indeterminate infarcts in the right caudate head and left thalamus. If there is concern for an acute infarct, consider further evaluation with MRI. Electronically Signed   By: Lorenza Cambridge M.D.   On: 01/27/2023 11:44     PHYSICAL EXAM  Temp:  [97.6 F (36.4 C)-98 F (36.7 C)] 97.9 F (36.6 C) (01/18 0437) Pulse Rate:  [62-86] 62 (01/18 0437) Resp:   [12-20] 17 (01/18 0437) BP: (143-175)/(98-112) 175/110 (01/18 0437) SpO2:  [95 %-99 %] 96 % (01/18 0437) Weight:  [106.1 kg] 106.1 kg (01/18 0437)  General - Well nourished, well developed, in no apparent distress.  Ophthalmologic - fundi not visualized due to noncooperation.  Cardiovascular - Regular rhythm and rate.  Mental Status -  Level of arousal and orientation to time, place, and person were intact. Language including expression, naming, repetition, comprehension was assessed and found intact. Attention span and concentration were normal. Recent and remote memory were intact. Fund of Knowledge was assessed and was intact.  Cranial Nerves II - XII - II - Visual field intact OU. III, IV, VI - Extraocular movements intact. V - Facial sensation intact bilaterally. VII - Facial movement intact bilaterally VIII - Hearing & vestibular intact bilaterally. X - Palate elevates symmetrically. XI - Chin turning & shoulder shrug intact bilaterally. XII - Tongue protrusion intact.  Motor Strength - The patient's strength was normal in all extremities and pronator drift was absent.  Bulk was normal and fasciculations were absent.   Motor Tone - Muscle tone was assessed at the neck and appendages and was normal.  Reflexes - The patient's reflexes were symmetrical in all extremities and he had no pathological reflexes.  Sensory - Light touch, temperature/pinprick were assessed and were symmetrical.    Coordination - The patient had normal movements in the hands and feet with no ataxia or dysmetria.  Tremor was absent.  Gait and Station - deferred.   ASSESSMENT/PLAN Mr. ROXAS SIGERS is a 48 y.o. male with history of hypertension and obesity admitted for left-sided numbness and heaviness. No TNK given due to outside window.    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 Patient counseled to be compliant with his antithrombotic medications Ongoing aggressive stroke risk factor management Therapy recommendations: None Disposition: Pending  Hypertension Stable on the high and BP high on presentation Long term BP goal normotensive  Hyperlipidemia Home meds: None LDL 100, goal < 70 Now on Lipitor 40 Continue statin at discharge  Other Stroke Risk Factors Obesity, Body mass index is 34.54 kg/m.  Family hx stroke (grandma)  Other Active Problems   Hospital day # 1  Marvel Plan, MD PhD Stroke Neurology 01/28/2023 3:20 PM    To contact Stroke Continuity provider, please refer to [email or web address]. After hours, contact General Neurology

## 2023-01-28 NOTE — Progress Notes (Signed)
PROGRESS NOTE        PATIENT DETAILS Name: Wayne Edwards Age: 48 y.o. Sex: male Date of Birth: 1975/05/09 Admit Date: 01/27/2023 Admitting Physician Rodolph Bong, MD OZD:[id number] London Sheer, MD  Brief Summary: Patient is a 48 y.o.  male with no significant past medical history-presented with left-sided weakness-found to have acute CVA.  Significant events: 1/17>> admit to [facility]  Significant studies: 1/17>> UDS: Not detected 1/17>> MRI brain: Right thalamic infarct. 1/17>> CTA head/neck: No significant stenosis 1/18>> LDL: 100 1/18>> A1c: 4.9  Significant microbiology data: None  Procedures: None  Consults: Neurology  Subjective: Lying comfortably in bed-denies any chest pain or shortness of breath.  Feels much better-some numbness on the left hand but overall much better.  Objective: Vitals: Blood pressure (!) 175/110, pulse 62, temperature 97.9 F (36.6 C), temperature source Oral, resp. rate 17, height 5\' 9"  (1.753 m), weight 106.1 kg, SpO2 96%.   Exam: Gen Exam:Alert awake-not in any distress HEENT:atraumatic, normocephalic Chest: B/L clear to auscultation anteriorly CVS:S1S2 regular Abdomen:soft non tender, non distended Extremities:no edema Neurology: Non focal Skin: no rash  Pertinent Labs/Radiology:    Latest Ref Rng & Units 01/28/2023    5:36 AM 01/27/2023    8:42 AM  CBC  WBC 4.0 - 10.5 K/uL 5.7  5.7   Hemoglobin 13.0 - 17.0 g/dL 42.5  95.6   Hematocrit 39.0 - 52.0 % 40.8  42.7   Platelets 150 - 400 K/uL 276  296     Lab Results  Component Value Date   NA 135 01/28/2023   K 3.8 01/28/2023   CL 106 01/28/2023   CO2 22 01/28/2023      Assessment/Plan: Acute CVA Suspicion for small vessel disease-however patient with no past medical history-prior old strokes seen on MRI imaging as well. Discussed with neurology-hypercoagulable workup/lower extremity Doppler/TCD ultrasound will be ordered Started high  intensity statin Continue aspirin/Plavix Await further recommendations from neurology  Elevated blood pressure without a diagnosis of hypertension Allow permissive hypertension-reassess in the next several days where the patient requires antihypertensives.  Class 1 Obesity: Estimated body mass index is 34.54 kg/m as calculated from the following:   Height as of this encounter: 5\' 9"  (1.753 m).   Weight as of this encounter: 106.1 kg.   Code status:   Code Status: Full Code   DVT Prophylaxis: enoxaparin (LOVENOX) injection 40 mg Start: 01/27/23 1800   Family Communication: None at bedside   Disposition Plan: Status is: Inpatient Remains inpatient appropriate because: Severity of illness   Planned Discharge Destination:Home   Diet: Diet Order             Diet Heart Room service appropriate? Yes; Fluid consistency: Thin  Diet effective now                     Antimicrobial agents: Anti-infectives (From admission, onward)    None        MEDICATIONS: Scheduled Meds:  aspirin EC  81 mg Oral Daily   atorvastatin  40 mg Oral Daily   clopidogrel  75 mg Oral Daily   enoxaparin (LOVENOX) injection  40 mg Subcutaneous Q24H   Continuous Infusions: PRN Meds:.acetaminophen **OR** acetaminophen (TYLENOL) oral liquid 160 mg/5 mL **OR** acetaminophen, albuterol, hydrALAZINE, magnesium citrate, ondansetron **OR** ondansetron (ZOFRAN) IV, oxyCODONE, senna-docusate, sorbitol   I have  personally reviewed following labs and imaging studies  LABORATORY DATA: CBC: Recent Labs  Lab 01/27/23 0842 01/28/23 0536  WBC 5.7 5.7  HGB 14.5 14.3  HCT 42.7 40.8  MCV 90.5 88.5  PLT 296 276    Basic Metabolic Panel: Recent Labs  Lab 01/27/23 0842 01/28/23 0536  NA 138 135  K 3.7 3.8  CL 103 106  CO2 25 22  GLUCOSE 99 92  BUN 13 15  CREATININE 1.19 1.09  CALCIUM 9.1 8.5*  MG  --  1.9    GFR: Estimated Creatinine Clearance: 100.6 mL/min (by C-G formula based on  SCr of 1.09 mg/dL).  Liver Function Tests: No results for input(s): "AST", "ALT", "ALKPHOS", "BILITOT", "PROT", "ALBUMIN" in the last 168 hours. No results for input(s): "LIPASE", "AMYLASE" in the last 168 hours. No results for input(s): "AMMONIA" in the last 168 hours.  Coagulation Profile: No results for input(s): "INR", "PROTIME" in the last 168 hours.  Cardiac Enzymes: No results for input(s): "CKTOTAL", "CKMB", "CKMBINDEX", "TROPONINI" in the last 168 hours.  BNP (last 3 results) No results for input(s): "PROBNP" in the last 8760 hours.  Lipid Profile: Recent Labs    01/28/23 0536  CHOL 167  HDL 35*  LDLCALC 100*  TRIG 160*  CHOLHDL 4.8    Thyroid Function Tests: No results for input(s): "TSH", "T4TOTAL", "FREET4", "T3FREE", "THYROIDAB" in the last 72 hours.  Anemia Panel: No results for input(s): "VITAMINB12", "FOLATE", "FERRITIN", "TIBC", "IRON", "RETICCTPCT" in the last 72 hours.  Urine analysis:    Component Value Date/Time   COLORURINE YELLOW 01/27/2023 2314   APPEARANCEUR CLEAR 01/27/2023 2314   LABSPEC 1.024 01/27/2023 2314   PHURINE 7.0 01/27/2023 2314   GLUCOSEU NEGATIVE 01/27/2023 2314   HGBUR SMALL (A) 01/27/2023 2314   BILIRUBINUR NEGATIVE 01/27/2023 2314   BILIRUBINUR negative 09/27/2019 1548   BILIRUBINUR neg 05/17/2012 1021   KETONESUR NEGATIVE 01/27/2023 2314   PROTEINUR NEGATIVE 01/27/2023 2314   UROBILINOGEN 0.2 09/27/2019 1548   NITRITE NEGATIVE 01/27/2023 2314   LEUKOCYTESUR NEGATIVE 01/27/2023 2314    Sepsis Labs: Lactic Acid, Venous No results found for: "LATICACIDVEN"  MICROBIOLOGY: No results found for this or any previous visit (from the past 240 hours).  RADIOLOGY STUDIES/RESULTS: CT ANGIO HEAD NECK W WO CM Result Date: 01/27/2023 CLINICAL DATA:  Stroke on same-day MRI head, assess for intracardiac shunt EXAM: CT ANGIOGRAPHY HEAD AND NECK WITH AND WITHOUT CONTRAST TECHNIQUE: Multidetector CT imaging of the head and neck was  performed using the standard protocol during bolus administration of intravenous contrast. Multiplanar CT image reconstructions and MIPs were obtained to evaluate the vascular anatomy. Carotid stenosis measurements (when applicable) are obtained utilizing NASCET criteria, using the distal internal carotid diameter as the denominator. RADIATION DOSE REDUCTION: This exam was performed according to the departmental dose-optimization program which includes automated exposure control, adjustment of the mA and/or kV according to patient size and/or use of iterative reconstruction technique. CONTRAST:  75mL OMNIPAQUE IOHEXOL 350 MG/ML SOLN COMPARISON:  01/27/2023 MRI head, 01/27/2023 CT head, no prior CTA available FINDINGS: CT HEAD FINDINGS For noncontrast findings, please see same day CT head. CTA NECK FINDINGS Aortic arch: Standard branching. Imaged portion shows no evidence of aneurysm or dissection. No significant stenosis of the major arch vessel origins. Right carotid system: No evidence of dissection, occlusion, or hemodynamically significant stenosis (greater than 50%). Left carotid system: No evidence of dissection, occlusion, or hemodynamically significant stenosis (greater than 50%). Vertebral arteries: No evidence of dissection, occlusion, or  hemodynamically significant stenosis (greater than 50%). Skeleton: No acute osseous abnormality. Other neck: No acute finding. Upper chest: No focal pulmonary opacity or pleural effusion. Review of the MIP images confirms the above findings CTA HEAD FINDINGS Anterior circulation: Both internal carotid arteries are patent to the termini, with calcifications but without significant stenosis. A1 segments patent. Normal anterior communicating artery. Anterior cerebral arteries are patent to their distal aspects without significant stenosis. No M1 stenosis or occlusion. MCA branches perfused to their distal aspects without significant stenosis. Posterior circulation: Vertebral  arteries patent to the vertebrobasilar junction without significant stenosis. Posterior inferior cerebellar arteries patent proximally. Basilar patent to its distal aspect without significant stenosis. Superior cerebellar arteries patent proximally. Patent P1 segments. PCAs perfused to their distal aspects without significant stenosis. The bilateral posterior communicating arteries are not visualized. Venous sinuses: As permitted by contrast timing, patent. Anatomic variants: None significant. No evidence of aneurysm or vascular malformation. Review of the MIP images confirms the above findings IMPRESSION: 1. No intracranial large vessel occlusion or significant stenosis. 2. No hemodynamically significant stenosis in the neck. 3. No evidence of aneurysm or vascular malformation. Electronically Signed   By: Wiliam Ke M.D.   On: 01/27/2023 18:48   MR BRAIN WO CONTRAST Result Date: 01/27/2023 CLINICAL DATA:  Provided history: Transient ischemic attack (TIA). Additional history provided: Left-sided weakness, dizziness. EXAM: MRI HEAD WITHOUT CONTRAST TECHNIQUE: Multiplanar, multiecho pulse sequences of the brain and surrounding structures were obtained without intravenous contrast. COMPARISON:  Head CT 01/27/2023. FINDINGS: Brain: Cerebral volume within normal limits. 8 mm acute infarct within the right thalamus. 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 Chronic lacunar infarcts within the left corona radiata, callosal body on the right, bilateral basal ganglia, left thalamus and within the right aspect of the pons. Mild multifocal T2 FLAIR hyperintense signal abnormality elsewhere within the cerebral white matter, nonspecific but compatible with chronic small vessel ischemic disease. Nonspecific chronic microhemorrhage within the mid right frontal lobe. No evidence of an intracranial mass. No extra-axial fluid collection. No midline shift. Vascular: Maintained flow voids within the proximal  large arterial vessels. Skull and upper cervical spine: No focal recent marrow lesion. Sinuses/Orbits: No mass or acute finding within the imaged orbits. Mild mucosal thickening within the right maxillary sinus. 12 mm mucous retention cyst, and mild background mucosal thickening, within the left maxillary sinus. IMPRESSION: 1. 8 mm acute infarct within the right thalamus. 2. 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 3. Chronic lacunar infarcts within the left corona radiata, callosal body on the right, bilateral basal ganglia, left thalamus and right aspect of the pons. 4. Background mild cerebral white matter chronic small vessel ischemic disease. 5. Nonspecific chronic microhemorrhage within the mid right frontal lobe. 6. Paranasal sinus disease as described Electronically Signed   By: Jackey Loge D.O.   On: 01/27/2023 14:46   CT Head Wo Contrast Result Date: 01/27/2023 CLINICAL DATA:  Transient ischemic attack (TIA) transient weakness EXAM: CT HEAD WITHOUT CONTRAST TECHNIQUE: Contiguous axial images were obtained from the base of the skull through the vertex without intravenous contrast. RADIATION DOSE REDUCTION: This exam was performed according to the departmental dose-optimization program which includes automated exposure control, adjustment of the mA and/or kV according to patient size and/or use of iterative reconstruction technique. COMPARISON:  None Available. FINDINGS: Brain: No hemorrhage. No hydrocephalus. No extra-axial fluid collection. There are chronic appearing, but technically age indeterminate infarcts in the right caudate head and left thalamus. No mass effect.  No mass lesion. Vascular: No hyperdense vessel or unexpected calcification. Skull: Normal. Negative for fracture or focal lesion. Sinuses/Orbits: No middle ear or mastoid effusion. Paranasal are clear. Orbits are unremarkable. Other: None. IMPRESSION: Chronic appearing, but technically age indeterminate infarcts in  the right caudate head and left thalamus. If there is concern for an acute infarct, consider further evaluation with MRI. Electronically Signed   By: Lorenza Cambridge M.D.   On: 01/27/2023 11:44     LOS: 1 day   Jeoffrey Massed, MD  [facility]    To contact the attending provider between 7A-7P or the covering provider during after hours 7P-7A, please log into the web site [email or web address] and access using universal [place] password for that web site. If you do not have the password, please call the hospital operator.  01/28/2023, 1:27 PM

## 2023-01-28 NOTE — Progress Notes (Signed)
  Echocardiogram 2D Echocardiogram has been performed.  Ocie Doyne RDCS 01/28/2023, 9:49 AM

## 2023-01-29 ENCOUNTER — Observation Stay (HOSPITAL_COMMUNITY): Payer: 59

## 2023-01-29 DIAGNOSIS — R030 Elevated blood-pressure reading, without diagnosis of hypertension: Secondary | ICD-10-CM | POA: Diagnosis not present

## 2023-01-29 DIAGNOSIS — I639 Cerebral infarction, unspecified: Secondary | ICD-10-CM

## 2023-01-29 DIAGNOSIS — I63331 Cerebral infarction due to thrombosis of right posterior cerebral artery: Secondary | ICD-10-CM | POA: Diagnosis not present

## 2023-01-29 MED ORDER — ATORVASTATIN CALCIUM 40 MG PO TABS: 40.0000 mg | ORAL_TABLET | Freq: Every day | ORAL | 2 refills | Status: AC

## 2023-01-29 MED ORDER — ASPIRIN 81 MG PO TBEC: 81.0000 mg | DELAYED_RELEASE_TABLET | Freq: Every day | ORAL | 12 refills | Status: AC

## 2023-01-29 MED ORDER — CLOPIDOGREL BISULFATE 75 MG PO TABS: 75.0000 mg | ORAL_TABLET | Freq: Every day | ORAL | 0 refills | Status: DC

## 2023-01-29 MED ORDER — LOSARTAN POTASSIUM 50 MG PO TABS: 50.0000 mg | ORAL_TABLET | Freq: Every day | ORAL | 2 refills | Status: AC

## 2023-01-29 NOTE — Discharge Summary (Signed)
PATIENT DETAILS Name: Wayne Edwards Age: 48 y.o. Sex: male Date of Birth: 11-29-1975 MRN: [id number]. Admitting Physician: Rodolph Bong, MD [id number] London Sheer, MD  Admit Date: 01/27/2023 Discharge date: 01/29/2023  Recommendations for Outpatient Follow-up:  Follow up with PCP in 1-2 weeks Please obtain CMP/CBC in one week Please ensure follow up with stroke clinic Please follow hypercoagulable panel  Admitted From:  Home  Disposition: Home   Discharge Condition: good  CODE STATUS:   Code Status: Full Code   Diet recommendation:  Diet Order             Diet - low sodium heart healthy           Diet Heart Room service appropriate? Yes; Fluid consistency: Thin  Diet effective now                    Brief Summary: Patient is a 48 y.o.  male with no significant past medical history-presented with left-sided weakness-found to have acute CVA.   Significant events: 1/17>> admit to [facility]   Significant studies: 1/17>> UDS: negative 1/17>> MRI brain: Right thalamic infarct. 1/17>> CTA head/neck: No significant stenosis 1/18>> LDL: 100 1/18>> A1c: 4.9 1/18>> echo: EF 55-60% 1/19>> transcranial ultrasound:no PFO   Significant microbiology data: None   Procedures: None   Consults: Neurology  Brief Hospital Course: Acute CVA Suspicion for small vessel disease-however patient with no past medical history-prior old strokes seen on MRI imaging as well. Underwent extensive workup as above-hypercoagulable workup still pending Recommendations from neurology are to continue aspirin/Plavix x 3 weeks followed by aspirin alone.  Continue high intensity statin Follow-up with stroke clinic.   Probable new diagnosis of HTN Low-dose losartan on discharge Follow-up with PCP.   Class 1 Obesity: Estimated body mass index is 34.54 kg/m as calculated from the following:   Height as of this encounter: 5\' 9"  (1.753 m).   Weight as of this encounter: 106.1  kg.   Discharge Diagnoses:  Principal Problem:   CVA (cerebral vascular accident) (HCC) Active Problems:   Elevated blood pressure reading   Discharge Instructions:  Activity:  As tolerated   Discharge Instructions     Ambulatory referral to Neurology   Complete by: As directed    An appointment is requested in approximately: 4 weeks   Ambulatory referral to Neurology   Complete by: As directed    Follow up with stroke clinic NP (Jessica Vanschaick or Darrol Angel, if both not available, consider Manson Allan, or Ahern) at [facility] in about 4 weeks. Thanks.   Call MD for:  difficulty breathing, headache or visual disturbances   Complete by: As directed    Call MD for:  extreme fatigue   Complete by: As directed    Call MD for:  persistant dizziness or light-headedness   Complete by: As directed    Diet - low sodium heart healthy   Complete by: As directed    Discharge instructions   Complete by: As directed    Follow with Primary MD  Lezlie Lye, Meda Coffee, MD in 1-2 weeks  You will get a phone call from the stroke clinic-if you do not hear from them-please give them the call-their number is on the after visit summary  Some blood work (hypercoagulable work up) is pending-please ask your Primary care MD or Neurology to follow up on these results  Take aspirin/Plavix x 3 weeks-after 3 weeks-stop Plavix and continue on aspirin  You have  been started on a cholesterol and a blood pressure medication-please follow-up with your primary care practitioner for further optimization.  Please get a complete blood count and chemistry panel checked by your Primary MD at your next visit, and again as instructed by your Primary MD.  Get Medicines reviewed and adjusted: Please take all your medications with you for your next visit with your Primary MD  Laboratory/radiological data: Please request your Primary MD to go over all hospital tests and procedure/radiological results at the  follow up, please ask your Primary MD to get all Hospital records sent to his/her office.  In some cases, they will be blood work, cultures and biopsy results pending at the time of your discharge. Please request that your primary care M.D. follows up on these results.  Also Note the following: If you experience worsening of your admission symptoms, develop shortness of breath, life threatening emergency, suicidal or homicidal thoughts you must seek medical attention immediately by calling 911 or calling your MD immediately  if symptoms less severe.  You must read complete instructions/literature along with all the possible adverse reactions/side effects for all the Medicines you take and that have been prescribed to you. Take any new Medicines after you have completely understood and accpet all the possible adverse reactions/side effects.   Do not drive when taking Pain medications or sleeping medications (Benzodaizepines)  Do not take more than prescribed Pain, Sleep and Anxiety Medications. It is not advisable to combine anxiety,sleep and pain medications without talking with your primary care practitioner  Special Instructions: If you have smoked or chewed Tobacco  in the last 2 yrs please stop smoking, stop any regular Alcohol  and or any Recreational drug use.  Wear Seat belts while driving.  Please note: You were cared for by a hospitalist during your hospital stay. Once you are discharged, your primary care physician will handle any further medical issues. Please note that NO REFILLS for any discharge medications will be authorized once you are discharged, as it is imperative that you return to your primary care physician (or establish a relationship with a primary care physician if you do not have one) for your post hospital discharge needs so that they can reassess your need for medications and monitor your lab values.   Increase activity slowly   Complete by: As directed        Allergies as of 01/29/2023       Reactions   Yellow Jacket Venom [bee Venom] Anaphylaxis   Beef-derived Drug Products    Pork-derived Products         Medication List     TAKE these medications    aspirin EC 81 MG tablet Take 1 tablet (81 mg total) by mouth daily. Swallow whole. Start taking on: January 30, 2023   atorvastatin 40 MG tablet Commonly known as: LIPITOR Take 1 tablet (40 mg total) by mouth daily. Start taking on: January 30, 2023   clopidogrel 75 MG tablet Commonly known as: PLAVIX Take 1 tablet (75 mg total) by mouth daily. Start taking on: January 30, 2023   ibuprofen 200 MG tablet Commonly known as: ADVIL Take 200 mg by mouth 2 (two) times daily as needed for headache.   losartan 50 MG tablet Commonly known as: Cozaar Take 1 tablet (50 mg total) by mouth daily.   MELATONIN PO Take 1 tablet by mouth at bedtime as needed (sleep).        Follow-up Information     [place]  London Sheer, MD. Schedule an appointment as soon as possible for a visit in 1 week(s).   Specialty: Family Medicine Contact information: [address] [place] [address] [phone]         [facility]. Schedule an appointment as soon as possible for a visit in 1 month(s).   Specialty: Neurology Why: Hospital follow up, Office will call with date/time, If you dont hear from them,please give them a call Contact information: [address] [place] [address] [phone]               Allergies  Allergen Reactions   Yellow Jacket Venom [Bee Venom] Anaphylaxis   Beef-Derived Drug Products    Pork-Derived Products      Other Procedures/Studies: ECHOCARDIOGRAM COMPLETE Result Date: 01/28/2023    ECHOCARDIOGRAM REPORT   Patient Name:   Wayne Edwards Date of Exam: 01/28/2023 Medical Rec #:  [id number]      Height:       69.0 in Accession #:    [id number]     Weight:       233.9 lb Date of Birth:   1975-02-23      BSA:          2.208 m Patient Age:    47 years       BP:           175/110 mmHg Patient Gender: M              HR:           70 bpm. Exam Location:  Inpatient Procedure: 2D Echo, Cardiac Doppler and Color Doppler Indications:    Stroke  History:        Patient has no prior history of Echocardiogram examinations.                 Risk Factors:Non-Smoker.  Sonographer:    Vern Claude Referring Phys: [id number] DANIEL V THOMPSON IMPRESSIONS  1. Left ventricular ejection fraction, by estimation, is 55 to 60%. The left ventricle has normal function. The left ventricle has no regional wall motion abnormalities. Left ventricular diastolic parameters were normal.  2. Right ventricular systolic function is normal. The right ventricular size is normal.  3. The mitral valve is normal in structure. Trivial mitral valve regurgitation. No evidence of mitral stenosis.  4. The aortic valve is tricuspid. Aortic valve regurgitation is not visualized. No aortic stenosis is present.  5. The inferior vena cava is normal in size with greater than 50% respiratory variability, suggesting right atrial pressure of 3 mmHg. Comparison(s): No prior Echocardiogram. FINDINGS  Left Ventricle: Left ventricular ejection fraction, by estimation, is 55 to 60%. The left ventricle has normal function. The left ventricle has no regional wall motion abnormalities. The left ventricular internal cavity size was normal in size. There is  no left ventricular hypertrophy. Left ventricular diastolic parameters were normal. Right Ventricle: The right ventricular size is normal. Right ventricular systolic function is normal. Left Atrium: Left atrial size was normal in size. Right Atrium: Right atrial size was normal in size. Pericardium: There is no evidence of pericardial effusion. Mitral Valve: The mitral valve is normal in structure. Trivial mitral valve regurgitation. No evidence of mitral valve stenosis. MV peak gradient, 3.0 mmHg. The mean mitral  valve gradient is 2.0 mmHg. Tricuspid Valve: The tricuspid valve is normal in structure. Tricuspid valve regurgitation is trivial. No evidence of tricuspid stenosis. Aortic Valve: The aortic valve is  tricuspid. Aortic valve regurgitation is not visualized. No aortic stenosis is present. Aortic valve mean gradient measures 2.0 mmHg. Aortic valve peak gradient measures 3.4 mmHg. Aortic valve area, by VTI measures 2.76 cm. Pulmonic Valve: The pulmonic valve was not well visualized. Pulmonic valve regurgitation is not visualized. No evidence of pulmonic stenosis. Aorta: The aortic root is normal in size and structure. Venous: The inferior vena cava is normal in size with greater than 50% respiratory variability, suggesting right atrial pressure of 3 mmHg. IAS/Shunts: No atrial level shunt detected by color flow Doppler.  LEFT VENTRICLE PLAX 2D LVIDd:         4.70 cm      Diastology LVIDs:         3.40 cm      LV e' medial:    11.40 cm/s LV PW:         1.00 cm      LV E/e' medial:  5.8 LV IVS:        0.80 cm      LV e' lateral:   10.90 cm/s LVOT diam:     2.10 cm      LV E/e' lateral: 6.1 LV SV:         50 LV SV Index:   23 LVOT Area:     3.46 cm  LV Volumes (MOD) LV vol d, MOD A2C: 159.0 ml LV vol d, MOD A4C: 150.0 ml LV vol s, MOD A2C: 81.8 ml LV vol s, MOD A4C: 76.2 ml LV SV MOD A2C:     77.2 ml LV SV MOD A4C:     150.0 ml LV SV MOD BP:      75.1 ml RIGHT VENTRICLE             IVC RV Mid diam:    3.20 cm     IVC diam: 1.60 cm RV S prime:     11.60 cm/s LEFT ATRIUM             Index        RIGHT ATRIUM           Index LA diam:        2.90 cm 1.31 cm/m   RA Area:     12.40 cm LA Vol (A2C):   32.9 ml 14.90 ml/m  RA Volume:   30.80 ml  13.95 ml/m LA Vol (A4C):   28.0 ml 12.68 ml/m LA Biplane Vol: 31.2 ml 14.13 ml/m  AORTIC VALVE                    PULMONIC VALVE AV Area (Vmax):    3.20 cm     PV Vmax:       0.79 m/s AV Area (Vmean):   2.34 cm     PV Peak grad:  2.5 mmHg AV Area (VTI):     2.76 cm AV Vmax:            91.70 cm/s AV Vmean:          66.200 cm/s AV VTI:            0.181 m AV Peak Grad:      3.4 mmHg AV Mean Grad:      2.0 mmHg LVOT Vmax:         84.60 cm/s LVOT Vmean:        44.800 cm/s LVOT VTI:          0.144 m LVOT/AV VTI ratio: 0.80  AORTA Ao Root diam: 3.50 cm Ao Asc diam:  3.50 cm MITRAL VALVE MV Area (PHT): 3.63 cm    SHUNTS MV Area VTI:   2.32 cm    Systemic VTI:  0.14 m MV Peak grad:  3.0 mmHg    Systemic Diam: 2.10 cm MV Mean grad:  2.0 mmHg MV Vmax:       0.86 m/s MV Vmean:      59.0 cm/s MV Decel Time: 209 msec MV E velocity: 66.00 cm/s MV A velocity: 85.90 cm/s MV E/A ratio:  0.77 Olga Millers MD Electronically signed by Olga Millers MD Signature Date/Time: 01/28/2023/2:36:11 PM    Final    CT ANGIO HEAD NECK W WO CM Result Date: 01/27/2023 CLINICAL DATA:  Stroke on same-day MRI head, assess for intracardiac shunt EXAM: CT ANGIOGRAPHY HEAD AND NECK WITH AND WITHOUT CONTRAST TECHNIQUE: Multidetector CT imaging of the head and neck was performed using the standard protocol during bolus administration of intravenous contrast. Multiplanar CT image reconstructions and MIPs were obtained to evaluate the vascular anatomy. Carotid stenosis measurements (when applicable) are obtained utilizing NASCET criteria, using the distal internal carotid diameter as the denominator. RADIATION DOSE REDUCTION: This exam was performed according to the departmental dose-optimization program which includes automated exposure control, adjustment of the mA and/or kV according to patient size and/or use of iterative reconstruction technique. CONTRAST:  75mL OMNIPAQUE IOHEXOL 350 MG/ML SOLN COMPARISON:  01/27/2023 MRI head, 01/27/2023 CT head, no prior CTA available FINDINGS: CT HEAD FINDINGS For noncontrast findings, please see same day CT head. CTA NECK FINDINGS Aortic arch: Standard branching. Imaged portion shows no evidence of aneurysm or dissection. No significant stenosis of the major arch vessel origins. Right  carotid system: No evidence of dissection, occlusion, or hemodynamically significant stenosis (greater than 50%). Left carotid system: No evidence of dissection, occlusion, or hemodynamically significant stenosis (greater than 50%). Vertebral arteries: No evidence of dissection, occlusion, or hemodynamically significant stenosis (greater than 50%). Skeleton: No acute osseous abnormality. Other neck: No acute finding. Upper chest: No focal pulmonary opacity or pleural effusion. Review of the MIP images confirms the above findings CTA HEAD FINDINGS Anterior circulation: Both internal carotid arteries are patent to the termini, with calcifications but without significant stenosis. A1 segments patent. Normal anterior communicating artery. Anterior cerebral arteries are patent to their distal aspects without significant stenosis. No M1 stenosis or occlusion. MCA branches perfused to their distal aspects without significant stenosis. Posterior circulation: Vertebral arteries patent to the vertebrobasilar junction without significant stenosis. Posterior inferior cerebellar arteries patent proximally. Basilar patent to its distal aspect without significant stenosis. Superior cerebellar arteries patent proximally. Patent P1 segments. PCAs perfused to their distal aspects without significant stenosis. The bilateral posterior communicating arteries are not visualized. Venous sinuses: As permitted by contrast timing, patent. Anatomic variants: None significant. No evidence of aneurysm or vascular malformation. Review of the MIP images confirms the above findings IMPRESSION: 1. No intracranial large vessel occlusion or significant stenosis. 2. No hemodynamically significant stenosis in the neck. 3. No evidence of aneurysm or vascular malformation. Electronically Signed   By: Wiliam Ke M.D.   On: 01/27/2023 18:48   MR BRAIN WO CONTRAST Result Date: 01/27/2023 CLINICAL DATA:  Provided history: Transient ischemic attack  (TIA). Additional history provided: Left-sided weakness, dizziness. EXAM: MRI HEAD WITHOUT CONTRAST TECHNIQUE: Multiplanar, multiecho pulse sequences of the brain and surrounding structures were obtained without intravenous contrast. COMPARISON:  Head CT 01/27/2023. FINDINGS: Brain: Cerebral volume within normal limits. 8 mm  acute infarct within the right thalamus. 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 Chronic lacunar infarcts within the left corona radiata, callosal body on the right, bilateral basal ganglia, left thalamus and within the right aspect of the pons. Mild multifocal T2 FLAIR hyperintense signal abnormality elsewhere within the cerebral white matter, nonspecific but compatible with chronic small vessel ischemic disease. Nonspecific chronic microhemorrhage within the mid right frontal lobe. No evidence of an intracranial mass. No extra-axial fluid collection. No midline shift. Vascular: Maintained flow voids within the proximal large arterial vessels. Skull and upper cervical spine: No focal recent marrow lesion. Sinuses/Orbits: No mass or acute finding within the imaged orbits. Mild mucosal thickening within the right maxillary sinus. 12 mm mucous retention cyst, and mild background mucosal thickening, within the left maxillary sinus. IMPRESSION: 1. 8 mm acute infarct within the right thalamus. 2. 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 3. Chronic lacunar infarcts within the left corona radiata, callosal body on the right, bilateral basal ganglia, left thalamus and right aspect of the pons. 4. Background mild cerebral white matter chronic small vessel ischemic disease. 5. Nonspecific chronic microhemorrhage within the mid right frontal lobe. 6. Paranasal sinus disease as described Electronically Signed   By: Jackey Loge D.O.   On: 01/27/2023 14:46   CT Head Wo Contrast Result Date: 01/27/2023 CLINICAL DATA:  Transient ischemic attack (TIA) transient weakness  EXAM: CT HEAD WITHOUT CONTRAST TECHNIQUE: Contiguous axial images were obtained from the base of the skull through the vertex without intravenous contrast. RADIATION DOSE REDUCTION: This exam was performed according to the departmental dose-optimization program which includes automated exposure control, adjustment of the mA and/or kV according to patient size and/or use of iterative reconstruction technique. COMPARISON:  None Available. FINDINGS: Brain: No hemorrhage. No hydrocephalus. No extra-axial fluid collection. There are chronic appearing, but technically age indeterminate infarcts in the right caudate head and left thalamus. No mass effect. No mass lesion. Vascular: No hyperdense vessel or unexpected calcification. Skull: Normal. Negative for fracture or focal lesion. Sinuses/Orbits: No middle ear or mastoid effusion. Paranasal are clear. Orbits are unremarkable. Other: None. IMPRESSION: Chronic appearing, but technically age indeterminate infarcts in the right caudate head and left thalamus. If there is concern for an acute infarct, consider further evaluation with MRI. Electronically Signed   By: Lorenza Cambridge M.D.   On: 01/27/2023 11:44     TODAY-DAY OF DISCHARGE:  Subjective:   Wayne Edwards today has no headache,no chest abdominal pain,no new weakness tingling or numbness, feels much better wants to go home today.   Objective:   Blood pressure (!) 147/113, pulse 82, temperature (!) 97.3 F (36.3 C), temperature source Oral, resp. rate 12, height 5\' 9"  (1.753 m), weight 106 kg, SpO2 94%. No intake or output data in the 24 hours ending 01/29/23 1528 Filed Weights   01/27/23 0840 01/28/23 0437 01/29/23 0500  Weight: 105.7 kg 106.1 kg 106 kg    Exam: Awake Alert, Oriented *3, No new F.N deficits, Normal affect [place].AT,PERRAL Supple Neck,No JVD, No cervical lymphadenopathy appriciated.  Symmetrical Chest wall movement, Good air movement bilaterally, CTAB RRR,No Gallops,Rubs or new  Murmurs, No Parasternal Heave +ve B.Sounds, Abd Soft, Non tender, No organomegaly appriciated, No rebound -guarding or rigidity. No Cyanosis, Clubbing or edema, No new Rash or bruise   PERTINENT RADIOLOGIC STUDIES: ECHOCARDIOGRAM COMPLETE Result Date: 01/28/2023    ECHOCARDIOGRAM REPORT   Patient Name:   Toa DONELL JALOMO Date of Exam: 01/28/2023 Medical Rec #:  [id number]      Height:       69.0 in Accession #:    [id number]     Weight:       233.9 lb Date of Birth:  1975/10/02      BSA:          2.208 m Patient Age:    47 years       BP:           175/110 mmHg Patient Gender: M              HR:           70 bpm. Exam Location:  Inpatient Procedure: 2D Echo, Cardiac Doppler and Color Doppler Indications:    Stroke  History:        Patient has no prior history of Echocardiogram examinations.                 Risk Factors:Non-Smoker.  Sonographer:    Vern Claude Referring Phys: [id number] DANIEL V THOMPSON IMPRESSIONS  1. Left ventricular ejection fraction, by estimation, is 55 to 60%. The left ventricle has normal function. The left ventricle has no regional wall motion abnormalities. Left ventricular diastolic parameters were normal.  2. Right ventricular systolic function is normal. The right ventricular size is normal.  3. The mitral valve is normal in structure. Trivial mitral valve regurgitation. No evidence of mitral stenosis.  4. The aortic valve is tricuspid. Aortic valve regurgitation is not visualized. No aortic stenosis is present.  5. The inferior vena cava is normal in size with greater than 50% respiratory variability, suggesting right atrial pressure of 3 mmHg. Comparison(s): No prior Echocardiogram. FINDINGS  Left Ventricle: Left ventricular ejection fraction, by estimation, is 55 to 60%. The left ventricle has normal function. The left ventricle has no regional wall motion abnormalities. The left ventricular internal cavity size was normal in size. There is  no left ventricular hypertrophy. Left  ventricular diastolic parameters were normal. Right Ventricle: The right ventricular size is normal. Right ventricular systolic function is normal. Left Atrium: Left atrial size was normal in size. Right Atrium: Right atrial size was normal in size. Pericardium: There is no evidence of pericardial effusion. Mitral Valve: The mitral valve is normal in structure. Trivial mitral valve regurgitation. No evidence of mitral valve stenosis. MV peak gradient, 3.0 mmHg. The mean mitral valve gradient is 2.0 mmHg. Tricuspid Valve: The tricuspid valve is normal in structure. Tricuspid valve regurgitation is trivial. No evidence of tricuspid stenosis. Aortic Valve: The aortic valve is tricuspid. Aortic valve regurgitation is not visualized. No aortic stenosis is present. Aortic valve mean gradient measures 2.0 mmHg. Aortic valve peak gradient measures 3.4 mmHg. Aortic valve area, by VTI measures 2.76 cm. Pulmonic Valve: The pulmonic valve was not well visualized. Pulmonic valve regurgitation is not visualized. No evidence of pulmonic stenosis. Aorta: The aortic root is normal in size and structure. Venous: The inferior vena cava is normal in size with greater than 50% respiratory variability, suggesting right atrial pressure of 3 mmHg. IAS/Shunts: No atrial level shunt detected by color flow Doppler.  LEFT VENTRICLE PLAX 2D LVIDd:         4.70 cm      Diastology LVIDs:         3.40 cm      LV e' medial:    11.40 cm/s LV PW:         1.00 cm      LV E/e' medial:  5.8 LV IVS:        0.80 cm      LV e' lateral:   10.90 cm/s LVOT diam:     2.10 cm      LV E/e' lateral: 6.1 LV SV:         50 LV SV Index:   23 LVOT Area:     3.46 cm  LV Volumes (MOD) LV vol d, MOD A2C: 159.0 ml LV vol d, MOD A4C: 150.0 ml LV vol s, MOD A2C: 81.8 ml LV vol s, MOD A4C: 76.2 ml LV SV MOD A2C:     77.2 ml LV SV MOD A4C:     150.0 ml LV SV MOD BP:      75.1 ml RIGHT VENTRICLE             IVC RV Mid diam:    3.20 cm     IVC diam: 1.60 cm RV S prime:      11.60 cm/s LEFT ATRIUM             Index        RIGHT ATRIUM           Index LA diam:        2.90 cm 1.31 cm/m   RA Area:     12.40 cm LA Vol (A2C):   32.9 ml 14.90 ml/m  RA Volume:   30.80 ml  13.95 ml/m LA Vol (A4C):   28.0 ml 12.68 ml/m LA Biplane Vol: 31.2 ml 14.13 ml/m  AORTIC VALVE                    PULMONIC VALVE AV Area (Vmax):    3.20 cm     PV Vmax:       0.79 m/s AV Area (Vmean):   2.34 cm     PV Peak grad:  2.5 mmHg AV Area (VTI):     2.76 cm AV Vmax:           91.70 cm/s AV Vmean:          66.200 cm/s AV VTI:            0.181 m AV Peak Grad:      3.4 mmHg AV Mean Grad:      2.0 mmHg LVOT Vmax:         84.60 cm/s LVOT Vmean:        44.800 cm/s LVOT VTI:          0.144 m LVOT/AV VTI ratio: 0.80  AORTA Ao Root diam: 3.50 cm Ao Asc diam:  3.50 cm MITRAL VALVE MV Area (PHT): 3.63 cm    SHUNTS MV Area VTI:   2.32 cm    Systemic VTI:  0.14 m MV Peak grad:  3.0 mmHg    Systemic Diam: 2.10 cm MV Mean grad:  2.0 mmHg MV Vmax:       0.86 m/s MV Vmean:      59.0 cm/s MV Decel Time: 209 msec MV E velocity: 66.00 cm/s MV A velocity: 85.90 cm/s MV E/A ratio:  0.77 Olga Millers MD Electronically signed by Olga Millers MD Signature Date/Time: 01/28/2023/2:36:11 PM    Final    CT ANGIO HEAD NECK W WO CM Result Date: 01/27/2023 CLINICAL DATA:  Stroke on same-day MRI head, assess for intracardiac shunt EXAM: CT ANGIOGRAPHY HEAD AND NECK WITH AND WITHOUT CONTRAST TECHNIQUE: Multidetector CT imaging of the head and neck was performed using the standard  protocol during bolus administration of intravenous contrast. Multiplanar CT image reconstructions and MIPs were obtained to evaluate the vascular anatomy. Carotid stenosis measurements (when applicable) are obtained utilizing NASCET criteria, using the distal internal carotid diameter as the denominator. RADIATION DOSE REDUCTION: This exam was performed according to the departmental dose-optimization program which includes automated exposure control,  adjustment of the mA and/or kV according to patient size and/or use of iterative reconstruction technique. CONTRAST:  75mL OMNIPAQUE IOHEXOL 350 MG/ML SOLN COMPARISON:  01/27/2023 MRI head, 01/27/2023 CT head, no prior CTA available FINDINGS: CT HEAD FINDINGS For noncontrast findings, please see same day CT head. CTA NECK FINDINGS Aortic arch: Standard branching. Imaged portion shows no evidence of aneurysm or dissection. No significant stenosis of the major arch vessel origins. Right carotid system: No evidence of dissection, occlusion, or hemodynamically significant stenosis (greater than 50%). Left carotid system: No evidence of dissection, occlusion, or hemodynamically significant stenosis (greater than 50%). Vertebral arteries: No evidence of dissection, occlusion, or hemodynamically significant stenosis (greater than 50%). Skeleton: No acute osseous abnormality. Other neck: No acute finding. Upper chest: No focal pulmonary opacity or pleural effusion. Review of the MIP images confirms the above findings CTA HEAD FINDINGS Anterior circulation: Both internal carotid arteries are patent to the termini, with calcifications but without significant stenosis. A1 segments patent. Normal anterior communicating artery. Anterior cerebral arteries are patent to their distal aspects without significant stenosis. No M1 stenosis or occlusion. MCA branches perfused to their distal aspects without significant stenosis. Posterior circulation: Vertebral arteries patent to the vertebrobasilar junction without significant stenosis. Posterior inferior cerebellar arteries patent proximally. Basilar patent to its distal aspect without significant stenosis. Superior cerebellar arteries patent proximally. Patent P1 segments. PCAs perfused to their distal aspects without significant stenosis. The bilateral posterior communicating arteries are not visualized. Venous sinuses: As permitted by contrast timing, patent. Anatomic variants:  None significant. No evidence of aneurysm or vascular malformation. Review of the MIP images confirms the above findings IMPRESSION: 1. No intracranial large vessel occlusion or significant stenosis. 2. No hemodynamically significant stenosis in the neck. 3. No evidence of aneurysm or vascular malformation. Electronically Signed   By: Wiliam Ke M.D.   On: 01/27/2023 18:48     PERTINENT LAB RESULTS: CBC: Recent Labs    01/27/23 0842 01/28/23 0536  WBC 5.7 5.7  HGB 14.5 14.3  HCT 42.7 40.8  PLT 296 276   CMET CMP     Component Value Date/Time   NA 135 01/28/2023 0536   NA 141 09/27/2019 1620   K 3.8 01/28/2023 0536   CL 106 01/28/2023 0536   CO2 22 01/28/2023 0536   GLUCOSE 92 01/28/2023 0536   BUN 15 01/28/2023 0536   BUN 14 09/27/2019 1620   CREATININE 1.09 01/28/2023 0536   CALCIUM 8.5 (L) 01/28/2023 0536   PROT 7.1 09/27/2019 1620   ALBUMIN 4.4 09/27/2019 1620   AST 19 09/27/2019 1620   ALT 19 09/27/2019 1620   ALKPHOS 85 09/27/2019 1620   BILITOT 0.4 09/27/2019 1620   GFRNONAA >60 01/28/2023 0536    GFR Estimated Creatinine Clearance: 100.5 mL/min (by C-G formula based on SCr of 1.09 mg/dL). No results for input(s): "LIPASE", "AMYLASE" in the last 72 hours. No results for input(s): "CKTOTAL", "CKMB", "CKMBINDEX", "TROPONINI" in the last 72 hours. Invalid input(s): "POCBNP" No results for input(s): "DDIMER" in the last 72 hours. Recent Labs    01/28/23 0536  HGBA1C 4.9   Recent Labs    01/28/23 0536  CHOL 167  HDL 35*  LDLCALC 100*  TRIG 160*  CHOLHDL 4.8   No results for input(s): "TSH", "T4TOTAL", "T3FREE", "THYROIDAB" in the last 72 hours.  Invalid input(s): "FREET3" No results for input(s): "VITAMINB12", "FOLATE", "FERRITIN", "TIBC", "IRON", "RETICCTPCT" in the last 72 hours. Coags: No results for input(s): "INR" in the last 72 hours.  Invalid input(s): "PT" Microbiology: No results found for this or any previous visit (from the past 240  hours).  FURTHER DISCHARGE INSTRUCTIONS:  Get Medicines reviewed and adjusted: Please take all your medications with you for your next visit with your Primary MD  Laboratory/radiological data: Please request your Primary MD to go over all hospital tests and procedure/radiological results at the follow up, please ask your Primary MD to get all Hospital records sent to his/her office.  In some cases, they will be blood work, cultures and biopsy results pending at the time of your discharge. Please request that your primary care M.D. goes through all the records of your hospital data and follows up on these results.  Also Note the following: If you experience worsening of your admission symptoms, develop shortness of breath, life threatening emergency, suicidal or homicidal thoughts you must seek medical attention immediately by calling 911 or calling your MD immediately  if symptoms less severe.  You must read complete instructions/literature along with all the possible adverse reactions/side effects for all the Medicines you take and that have been prescribed to you. Take any new Medicines after you have completely understood and accpet all the possible adverse reactions/side effects.   Do not drive when taking Pain medications or sleeping medications (Benzodaizepines)  Do not take more than prescribed Pain, Sleep and Anxiety Medications. It is not advisable to combine anxiety,sleep and pain medications without talking with your primary care practitioner  Special Instructions: If you have smoked or chewed Tobacco  in the last 2 yrs please stop smoking, stop any regular Alcohol  and or any Recreational drug use.  Wear Seat belts while driving.  Please note: You were cared for by a hospitalist during your hospital stay. Once you are discharged, your primary care physician will handle any further medical issues. Please note that NO REFILLS for any discharge medications will be authorized once  you are discharged, as it is imperative that you return to your primary care physician (or establish a relationship with a primary care physician if you do not have one) for your post hospital discharge needs so that they can reassess your need for medications and monitor your lab values.  Total Time spent coordinating discharge including counseling, education and face to face time equals greater than 30 minutes.  SignedJeoffrey Massed 01/29/2023 3:28 PM

## 2023-01-29 NOTE — Progress Notes (Signed)
STROKE TEAM PROGRESS NOTE   SUBJECTIVE (INTERVAL HISTORY) No family is at the bedside. No complains over night. TCD bubble negative for PFO.    OBJECTIVE Temp:  [97.3 F (36.3 C)-97.9 F (36.6 C)] 97.3 F (36.3 C) (01/19 0352) Pulse Rate:  [79-91] 82 (01/19 0352) Cardiac Rhythm: Normal sinus rhythm (01/19 0840) Resp:  [12-19] 12 (01/19 0900) BP: (147-174)/(97-116) 147/113 (01/19 0900) SpO2:  [94 %-99 %] 94 % (01/19 0352) Weight:  [106 kg] 106 kg (01/19 0500)  Recent Labs  Lab 01/27/23 0845  GLUCAP 97   Recent Labs  Lab 01/27/23 0842 01/28/23 0536  NA 138 135  K 3.7 3.8  CL 103 106  CO2 25 22  GLUCOSE 99 92  BUN 13 15  CREATININE 1.19 1.09  CALCIUM 9.1 8.5*  MG  --  1.9   No results for input(s): "AST", "ALT", "ALKPHOS", "BILITOT", "PROT", "ALBUMIN" in the last 168 hours. Recent Labs  Lab 01/27/23 0842 01/28/23 0536  WBC 5.7 5.7  HGB 14.5 14.3  HCT 42.7 40.8  MCV 90.5 88.5  PLT 296 276   No results for input(s): "CKTOTAL", "CKMB", "CKMBINDEX", "TROPONINI" in the last 168 hours. No results for input(s): "LABPROT", "INR" in the last 72 hours. Recent Labs    01/27/23 2314  COLORURINE YELLOW  LABSPEC 1.024  PHURINE 7.0  GLUCOSEU NEGATIVE  HGBUR SMALL*  BILIRUBINUR NEGATIVE  KETONESUR NEGATIVE  PROTEINUR NEGATIVE  NITRITE NEGATIVE  LEUKOCYTESUR NEGATIVE       Component Value Date/Time   CHOL 167 01/28/2023 0536   CHOL 171 09/27/2019 1620   TRIG 160 (H) 01/28/2023 0536   HDL 35 (L) 01/28/2023 0536   HDL 49 09/27/2019 1620   CHOLHDL 4.8 01/28/2023 0536   VLDL 32 01/28/2023 0536   LDLCALC 100 (H) 01/28/2023 0536   LDLCALC 105 (H) 09/27/2019 1620   Lab Results  Component Value Date   HGBA1C 4.9 01/28/2023      Component Value Date/Time   LABOPIA NONE DETECTED 01/27/2023 2314   COCAINSCRNUR NONE DETECTED 01/27/2023 2314   LABBENZ NONE DETECTED 01/27/2023 2314   AMPHETMU NONE DETECTED 01/27/2023 2314   THCU NONE DETECTED 01/27/2023 2314    LABBARB NONE DETECTED 01/27/2023 2314    No results for input(s): "ETH" in the last 168 hours.  I have personally reviewed the radiological images below and agree with the radiology interpretations.  ECHOCARDIOGRAM COMPLETE Result Date: 01/28/2023    ECHOCARDIOGRAM REPORT   Patient Name:   Mikel ELVIRA BRUNDRETT Date of Exam: 01/28/2023 Medical Rec #:  [id number]      Height:       69.0 in Accession #:    [id number]     Weight:       233.9 lb Date of Birth:  January 26, 1975      BSA:          2.208 m Patient Age:    48 years       BP:           175/110 mmHg Patient Gender: M              HR:           70 bpm. Exam Location:  Inpatient Procedure: 2D Echo, Cardiac Doppler and Color Doppler Indications:    Stroke  History:        Patient has no prior history of Echocardiogram examinations.                 Risk  Factors:Non-Smoker.  Sonographer:    Vern Claude Referring Phys: [id number] DANIEL V THOMPSON IMPRESSIONS  1. Left ventricular ejection fraction, by estimation, is 55 to 60%. The left ventricle has normal function. The left ventricle has no regional wall motion abnormalities. Left ventricular diastolic parameters were normal.  2. Right ventricular systolic function is normal. The right ventricular size is normal.  3. The mitral valve is normal in structure. Trivial mitral valve regurgitation. No evidence of mitral stenosis.  4. The aortic valve is tricuspid. Aortic valve regurgitation is not visualized. No aortic stenosis is present.  5. The inferior vena cava is normal in size with greater than 50% respiratory variability, suggesting right atrial pressure of 3 mmHg. Comparison(s): No prior Echocardiogram. FINDINGS  Left Ventricle: Left ventricular ejection fraction, by estimation, is 55 to 60%. The left ventricle has normal function. The left ventricle has no regional wall motion abnormalities. The left ventricular internal cavity size was normal in size. There is  no left ventricular hypertrophy. Left ventricular diastolic  parameters were normal. Right Ventricle: The right ventricular size is normal. Right ventricular systolic function is normal. Left Atrium: Left atrial size was normal in size. Right Atrium: Right atrial size was normal in size. Pericardium: There is no evidence of pericardial effusion. Mitral Valve: The mitral valve is normal in structure. Trivial mitral valve regurgitation. No evidence of mitral valve stenosis. MV peak gradient, 3.0 mmHg. The mean mitral valve gradient is 2.0 mmHg. Tricuspid Valve: The tricuspid valve is normal in structure. Tricuspid valve regurgitation is trivial. No evidence of tricuspid stenosis. Aortic Valve: The aortic valve is tricuspid. Aortic valve regurgitation is not visualized. No aortic stenosis is present. Aortic valve mean gradient measures 2.0 mmHg. Aortic valve peak gradient measures 3.4 mmHg. Aortic valve area, by VTI measures 2.76 cm. Pulmonic Valve: The pulmonic valve was not well visualized. Pulmonic valve regurgitation is not visualized. No evidence of pulmonic stenosis. Aorta: The aortic root is normal in size and structure. Venous: The inferior vena cava is normal in size with greater than 50% respiratory variability, suggesting right atrial pressure of 3 mmHg. IAS/Shunts: No atrial level shunt detected by color flow Doppler.  LEFT VENTRICLE PLAX 2D LVIDd:         4.70 cm      Diastology LVIDs:         3.40 cm      LV e' medial:    11.40 cm/s LV PW:         1.00 cm      LV E/e' medial:  5.8 LV IVS:        0.80 cm      LV e' lateral:   10.90 cm/s LVOT diam:     2.10 cm      LV E/e' lateral: 6.1 LV SV:         50 LV SV Index:   23 LVOT Area:     3.46 cm  LV Volumes (MOD) LV vol d, MOD A2C: 159.0 ml LV vol d, MOD A4C: 150.0 ml LV vol s, MOD A2C: 81.8 ml LV vol s, MOD A4C: 76.2 ml LV SV MOD A2C:     77.2 ml LV SV MOD A4C:     150.0 ml LV SV MOD BP:      75.1 ml RIGHT VENTRICLE             IVC RV Mid diam:    3.20 cm     IVC diam: 1.60 cm RV S  prime:     11.60 cm/s LEFT ATRIUM              Index        RIGHT ATRIUM           Index LA diam:        2.90 cm 1.31 cm/m   RA Area:     12.40 cm LA Vol (A2C):   32.9 ml 14.90 ml/m  RA Volume:   30.80 ml  13.95 ml/m LA Vol (A4C):   28.0 ml 12.68 ml/m LA Biplane Vol: 31.2 ml 14.13 ml/m  AORTIC VALVE                    PULMONIC VALVE AV Area (Vmax):    3.20 cm     PV Vmax:       0.79 m/s AV Area (Vmean):   2.34 cm     PV Peak grad:  2.5 mmHg AV Area (VTI):     2.76 cm AV Vmax:           91.70 cm/s AV Vmean:          66.200 cm/s AV VTI:            0.181 m AV Peak Grad:      3.4 mmHg AV Mean Grad:      2.0 mmHg LVOT Vmax:         84.60 cm/s LVOT Vmean:        44.800 cm/s LVOT VTI:          0.144 m LVOT/AV VTI ratio: 0.80  AORTA Ao Root diam: 3.50 cm Ao Asc diam:  3.50 cm MITRAL VALVE MV Area (PHT): 3.63 cm    SHUNTS MV Area VTI:   2.32 cm    Systemic VTI:  0.14 m MV Peak grad:  3.0 mmHg    Systemic Diam: 2.10 cm MV Mean grad:  2.0 mmHg MV Vmax:       0.86 m/s MV Vmean:      59.0 cm/s MV Decel Time: 209 msec MV E velocity: 66.00 cm/s MV A velocity: 85.90 cm/s MV E/A ratio:  0.77 Olga Millers MD Electronically signed by Olga Millers MD Signature Date/Time: 01/28/2023/2:36:11 PM    Final    CT ANGIO HEAD NECK W WO CM Result Date: 01/27/2023 CLINICAL DATA:  Stroke on same-day MRI head, assess for intracardiac shunt EXAM: CT ANGIOGRAPHY HEAD AND NECK WITH AND WITHOUT CONTRAST TECHNIQUE: Multidetector CT imaging of the head and neck was performed using the standard protocol during bolus administration of intravenous contrast. Multiplanar CT image reconstructions and MIPs were obtained to evaluate the vascular anatomy. Carotid stenosis measurements (when applicable) are obtained utilizing NASCET criteria, using the distal internal carotid diameter as the denominator. RADIATION DOSE REDUCTION: This exam was performed according to the departmental dose-optimization program which includes automated exposure control, adjustment of the mA and/or kV  according to patient size and/or use of iterative reconstruction technique. CONTRAST:  75mL OMNIPAQUE IOHEXOL 350 MG/ML SOLN COMPARISON:  01/27/2023 MRI head, 01/27/2023 CT head, no prior CTA available FINDINGS: CT HEAD FINDINGS For noncontrast findings, please see same day CT head. CTA NECK FINDINGS Aortic arch: Standard branching. Imaged portion shows no evidence of aneurysm or dissection. No significant stenosis of the major arch vessel origins. Right carotid system: No evidence of dissection, occlusion, or hemodynamically significant stenosis (greater than 50%). Left carotid system: No evidence of dissection, occlusion, or hemodynamically significant stenosis (greater than 50%).  Vertebral arteries: No evidence of dissection, occlusion, or hemodynamically significant stenosis (greater than 50%). Skeleton: No acute osseous abnormality. Other neck: No acute finding. Upper chest: No focal pulmonary opacity or pleural effusion. Review of the MIP images confirms the above findings CTA HEAD FINDINGS Anterior circulation: Both internal carotid arteries are patent to the termini, with calcifications but without significant stenosis. A1 segments patent. Normal anterior communicating artery. Anterior cerebral arteries are patent to their distal aspects without significant stenosis. No M1 stenosis or occlusion. MCA branches perfused to their distal aspects without significant stenosis. Posterior circulation: Vertebral arteries patent to the vertebrobasilar junction without significant stenosis. Posterior inferior cerebellar arteries patent proximally. Basilar patent to its distal aspect without significant stenosis. Superior cerebellar arteries patent proximally. Patent P1 segments. PCAs perfused to their distal aspects without significant stenosis. The bilateral posterior communicating arteries are not visualized. Venous sinuses: As permitted by contrast timing, patent. Anatomic variants: None significant. No evidence of  aneurysm or vascular malformation. Review of the MIP images confirms the above findings IMPRESSION: 1. No intracranial large vessel occlusion or significant stenosis. 2. No hemodynamically significant stenosis in the neck. 3. No evidence of aneurysm or vascular malformation. Electronically Signed   By: Wiliam Ke M.D.   On: 01/27/2023 18:48   MR BRAIN WO CONTRAST Result Date: 01/27/2023 CLINICAL DATA:  Provided history: Transient ischemic attack (TIA). Additional history provided: Left-sided weakness, dizziness. EXAM: MRI HEAD WITHOUT CONTRAST TECHNIQUE: Multiplanar, multiecho pulse sequences of the brain and surrounding structures were obtained without intravenous contrast. COMPARISON:  Head CT 01/27/2023. FINDINGS: Brain: Cerebral volume within normal limits. 8 mm acute infarct within the right thalamus. 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 Chronic lacunar infarcts within the left corona radiata, callosal body on the right, bilateral basal ganglia, left thalamus and within the right aspect of the pons. Mild multifocal T2 FLAIR hyperintense signal abnormality elsewhere within the cerebral white matter, nonspecific but compatible with chronic small vessel ischemic disease. Nonspecific chronic microhemorrhage within the mid right frontal lobe. No evidence of an intracranial mass. No extra-axial fluid collection. No midline shift. Vascular: Maintained flow voids within the proximal large arterial vessels. Skull and upper cervical spine: No focal recent marrow lesion. Sinuses/Orbits: No mass or acute finding within the imaged orbits. Mild mucosal thickening within the right maxillary sinus. 12 mm mucous retention cyst, and mild background mucosal thickening, within the left maxillary sinus. IMPRESSION: 1. 8 mm acute infarct within the right thalamus. 2. 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 3. Chronic lacunar infarcts within the left corona radiata, callosal body on the  right, bilateral basal ganglia, left thalamus and right aspect of the pons. 4. Background mild cerebral white matter chronic small vessel ischemic disease. 5. Nonspecific chronic microhemorrhage within the mid right frontal lobe. 6. Paranasal sinus disease as described Electronically Signed   By: Jackey Loge D.O.   On: 01/27/2023 14:46   CT Head Wo Contrast Result Date: 01/27/2023 CLINICAL DATA:  Transient ischemic attack (TIA) transient weakness EXAM: CT HEAD WITHOUT CONTRAST TECHNIQUE: Contiguous axial images were obtained from the base of the skull through the vertex without intravenous contrast. RADIATION DOSE REDUCTION: This exam was performed according to the departmental dose-optimization program which includes automated exposure control, adjustment of the mA and/or kV according to patient size and/or use of iterative reconstruction technique. COMPARISON:  None Available. FINDINGS: Brain: No hemorrhage. No hydrocephalus. No extra-axial fluid collection. There are chronic appearing, but technically age indeterminate infarcts in the right  caudate head and left thalamus. No mass effect. No mass lesion. Vascular: No hyperdense vessel or unexpected calcification. Skull: Normal. Negative for fracture or focal lesion. Sinuses/Orbits: No middle ear or mastoid effusion. Paranasal are clear. Orbits are unremarkable. Other: None. IMPRESSION: Chronic appearing, but technically age indeterminate infarcts in the right caudate head and left thalamus. If there is concern for an acute infarct, consider further evaluation with MRI. Electronically Signed   By: Lorenza Cambridge M.D.   On: 01/27/2023 11:44     PHYSICAL EXAM  Temp:  [97.3 F (36.3 C)-97.9 F (36.6 C)] 97.3 F (36.3 C) (01/19 0352) Pulse Rate:  [79-91] 82 (01/19 0352) Resp:  [12-19] 12 (01/19 0900) BP: (147-174)/(97-116) 147/113 (01/19 0900) SpO2:  [94 %-99 %] 94 % (01/19 0352) Weight:  [106 kg] 106 kg (01/19 0500)  General - Well nourished, well  developed, in no apparent distress.  Ophthalmologic - fundi not visualized due to noncooperation.  Cardiovascular - Regular rhythm and rate.  Mental Status -  Level of arousal and orientation to time, place, and person were intact. Language including expression, naming, repetition, comprehension was assessed and found intact. Attention span and concentration were normal. Recent and remote memory were intact. Fund of Knowledge was assessed and was intact.  Cranial Nerves II - XII - II - Visual field intact OU. III, IV, VI - Extraocular movements intact. V - Facial sensation intact bilaterally. VII - Facial movement intact bilaterally VIII - Hearing & vestibular intact bilaterally. X - Palate elevates symmetrically. XI - Chin turning & shoulder shrug intact bilaterally. XII - Tongue protrusion intact.  Motor Strength - The patient's strength was normal in all extremities and pronator drift was absent.  Bulk was normal and fasciculations were absent.   Motor Tone - Muscle tone was assessed at the neck and appendages and was normal.  Reflexes - The patient's reflexes were symmetrical in all extremities and he had no pathological reflexes.  Sensory - Light touch, temperature/pinprick were assessed and were symmetrical.    Coordination - The patient had normal movements in the hands and feet with no ataxia or dysmetria.  Tremor was absent.  Gait and Station - deferred.   ASSESSMENT/PLAN Mr. Wayne Edwards is a 48 y.o. male with history of hypertension and obesity admitted for left-sided numbness and heaviness. No TNK given due to outside window.    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 Patient counseled to be compliant with his antithrombotic medications Ongoing aggressive stroke risk factor management Therapy recommendations: None Disposition: Pending  Hypertension Stable on the high and BP high on presentation Long term BP goal normotensive  Hyperlipidemia Home meds: None LDL 100, goal < 70 Now on Lipitor 40 Continue statin at discharge  Other Stroke Risk Factors Obesity, Body mass index is 34.51 kg/m.  Family hx stroke (grandma)  Other Active Problems   Hospital day # 1  Neurology will sign off. Please call with questions. Pt will follow up with stroke clinic NP at [facility] in  about 4 weeks. Thanks for the consult.   Marvel Plan, MD PhD Stroke Neurology 01/29/2023 3:22 PM    To contact Stroke Continuity provider, please refer to [email or web address]. After hours, contact General Neurology

## 2023-01-29 NOTE — Plan of Care (Signed)
  Problem: Health Behavior/Discharge Planning: Goal: Ability to manage health-related needs will improve Outcome: Progressing   Problem: Health Behavior/Discharge Planning: Goal: Ability to manage health-related needs will improve Outcome: Progressing   Problem: Clinical Measurements: Goal: Will remain free from infection Outcome: Progressing   Problem: Coping: Goal: Level of anxiety will decrease Outcome: Progressing

## 2023-01-29 NOTE — Progress Notes (Signed)
VASCULAR LAB    TCD bubble study has been performed.  See CV proc for preliminary results.   Ladine Kiper, RVT 01/29/2023, 3:37 PM

## 2023-01-30 LAB — LUPUS ANTICOAGULANT PANEL
DRVVT: 31 s (ref 0.0–47.0)
PTT Lupus Anticoagulant: 32.6 s (ref 0.0–43.5)

## 2023-01-30 LAB — ANA W/REFLEX IF POSITIVE: Anti Nuclear Antibody (ANA): NEGATIVE

## 2023-01-30 LAB — HOMOCYSTEINE: Homocysteine: 14.9 umol/L — ABNORMAL HIGH (ref 0.0–14.5)

## 2023-01-31 LAB — BETA-2-GLYCOPROTEIN I ABS, IGG/M/A
Beta-2 Glyco I IgG: <9 GPI IgG units (ref 0–20)
Beta-2-Glycoprotein I IgA: <9 GPI IgA units (ref 0–25)
Beta-2-Glycoprotein I IgM: <9 GPI IgM units (ref 0–32)

## 2023-01-31 LAB — CARDIOLIPIN ANTIBODIES, IGG, IGM, IGA
Anticardiolipin IgA: <9 [APL'U]/mL (ref 0–11)
Anticardiolipin IgG: <9 [GPL'U]/mL (ref 0–14)
Anticardiolipin IgM: 25 [MPL'U]/mL — ABNORMAL HIGH (ref 0–12)

## 2023-03-01 NOTE — Patient Instructions (Signed)
 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 Continue aspirin 81 mg daily  and atorvastatin 40mg  daily for secondary stroke prevention. Discussed secondary stroke prevention measures and importance of close PCP follow up for aggressive stroke risk factor management. I have gone over the pathophysiology of stroke, warning signs and symptoms, risk factors and their management in some detail with instructions to go to the closest emergency room for symptoms of concern. HTN: BP goal <130/90.  Stable on losartan. Continue per PCP HLD: LDL goal <70. Recent LDL 100. Continue atorvastatin 40mg  daily per PCP.  DMII: A1c goal<7.0. Recent A1c 4.9. Not diabetic. Continue healthy, well balanced diet.  Obesity: continue close follow up with PCP for weight management. Healthy, well balanced diet advised.   Elevated cardiolipin IgM: likely reactionary, will repeat   Goals:  1) Maintain strict control of hypertension with blood pressure goal below 130/90 2) Maintain good control of diabetes with hemoglobin A1c goal below 7%  3) Maintain good control of lipids with LDL cholesterol goal below 70 mg/dL.  4) Eat a healthy diet with plenty of whole grains, cereals, fruits and vegetables, exercise regularly and maintain ideal body weight   Resources: https://www.williams.biz/  Please make sure you are staying well hydrated. I recommend 50-60 ounces daily. Well balanced diet and regular exercise encouraged. Consistent sleep schedule with 6-8 hours recommended.   Please continue follow up with care team as directed.   Follow up with me as needed  You may receive a survey regarding today's visit. I encourage you to leave honest feed back as I do use this information to improve patient care. Thank you for seeing me today!

## 2023-03-01 NOTE — Progress Notes (Signed)
 [facility] [address] [place]. [place] [address] [phone]       HOSPITAL FOLLOW UP NOTE  Mr. ALWIN LANIGAN Date of Birth:  [birth year] Medical Record Number:  [id number]   Reason for Referral:  hospital stroke follow up    SUBJECTIVE:   CHIEF COMPLAINT:  Chief Complaint  Patient presents with   Hospitalization Follow-up    Rm1, alone, [facility] hospital follow up:pt stated winded easily, more fatigued.     HPI:   Wayne Edwards is a 48 y.o. who  has no past medical history on file.  Patient presented on 01/27/2023 with left sided weakness. 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 MRI showed acute right thalamic infarct. UDS negative. LDL 100. Started on atorvastatin 40mg  daily. A1C 4.9. Started on asa and Plavix for three weeks then asa alone. No therapy recommendations. Personally reviewed hospitalization pertinent progress notes, lab work and imaging.  Evaluated by Dr Roda Shutters.   Since being discharged home, he reports doing well. He tires easily. He reports that he gets winded after walking 1/2 mile when previously walking a mile easily. He does note more left leg weakness when tired. He is not able to exercise as much as he would like but tries to walk 1-2 times a week.   He has not checked BP at home. He is tolerating losartan. Typical readings at PCP office 120s/70s. He continues atorvastatin and tolerating well. He was seen by PCP about 2-3 weeks ago and blood work updated. Plavix and asa for three weeks then has continues asa. He lives with his wife. Works full time. Drives without difficulty. Able to manage medication and home finances.   Cardiolipin IgG and homocysteine labs mildly elevated. Remaining hypercoagulable labs normal.    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 CT head and neck unremarkable MRI acute right thalamic infarct, old right BG, left CR, left thalamus, right pontine infarcts 2D  Echo EF 55 to 60% TCD bubble study negative for PFO   A1C Lab Results  Component Value Date   HGBA1C 4.9 01/28/2023    Lipid Panel     Component Value Date/Time   CHOL 167 01/28/2023 0536   CHOL 171 09/27/2019 1620   TRIG 160 (H) 01/28/2023 0536   HDL 35 (L) 01/28/2023 0536   HDL 49 09/27/2019 1620   CHOLHDL 4.8 01/28/2023 0536   VLDL 32 01/28/2023 0536   LDLCALC 100 (H) 01/28/2023 0536   LDLCALC 105 (H) 09/27/2019 1620   LABVLDL 17 09/27/2019 1620      ROS:   14 system review of systems performed and negative with exception of those listed in HPI  PMH: History reviewed. No pertinent past medical history.  PSH:  Past Surgical History:  Procedure Laterality Date   LITHOTRIPSY     VASECTOMY     WISDOM TOOTH EXTRACTION      Social History:  Social History   Socioeconomic History   Marital status: Married    Spouse name: Not on file   Number of children: Not on file   Years of education: Not on file   Highest education level: Not on file  Occupational History   Not on file  Tobacco Use   Smoking status: Never   Smokeless tobacco: Never  Substance and Sexual Activity   Alcohol use: Yes    Comment: rarely   Drug use: No   Sexual activity: Yes  Other Topics Concern  Not on file  Social History Narrative   Not on file   Social Drivers of Health   Financial Resource Strain: Not on file  Food Insecurity: No Food Insecurity (01/27/2023)   Hunger Vital Sign    Worried About Running Out of Food in the Last Year: Never true    Ran Out of Food in the Last Year: Never true  Transportation Needs: No Transportation Needs (01/27/2023)   PRAPARE - Administrator, Civil Service (Medical): No    Lack of Transportation (Non-Medical): No  Physical Activity: Not on file  Stress: Not on file  Social Connections: Not on file  Intimate Partner Violence: Not At Risk (01/27/2023)   Humiliation, Afraid, Rape, and Kick questionnaire    Fear of Current or  Ex-Partner: No    Emotionally Abused: No    Physically Abused: No    Sexually Abused: No    Family History:  Family History  Problem Relation Age of Onset   Healthy Mother    Healthy Father    Healthy Sister    Healthy Daughter    Healthy Son    Hyperlipidemia Maternal Grandmother    Healthy Maternal Grandfather    Stroke Paternal Grandmother    Healthy Paternal Grandfather     Medications:   Current Outpatient Medications on File Prior to Visit  Medication Sig Dispense Refill   aspirin EC 81 MG tablet Take 1 tablet (81 mg total) by mouth daily. Swallow whole. 30 tablet 12   atorvastatin (LIPITOR) 40 MG tablet Take 1 tablet (40 mg total) by mouth daily. 30 tablet 2   losartan (COZAAR) 50 MG tablet Take 1 tablet (50 mg total) by mouth daily. 30 tablet 2   MELATONIN PO Take 1 tablet by mouth at bedtime as needed (sleep).     No current facility-administered medications on file prior to visit.    Allergies:   Allergies  Allergen Reactions   Yellow Jacket Venom [Bee Venom] Anaphylaxis   Beef-Derived Drug Products    Pork-Derived Products       OBJECTIVE:  Physical Exam  Vitals:   03/06/23 0804  BP: 136/74  Pulse: 79  Weight: 225 lb 8.8 oz (102.3 kg)  Height: 5\' 9"  (1.753 m)   Body mass index is 33.31 kg/m. No results found.     09/27/2019    3:17 PM  Depression screen PHQ 2/9  Decreased Interest 0  Down, Depressed, Hopeless 0  PHQ - 2 Score 0     General: well developed, well nourished, seated, in no evident distress Head: head normocephalic and atraumatic.   Neck: supple with no carotid or supraclavicular bruits Cardiovascular: regular rate and rhythm, no murmurs Musculoskeletal: no deformity Skin:  no rash/petichiae Vascular:  Normal pulses all extremities   Neurologic Exam Mental Status: Awake and fully alert.  Fluent speech and language.  Oriented to place and time. Recent and remote memory intact. Attention span, concentration and fund of  knowledge appropriate. Mood and affect appropriate.  Cranial Nerves: Fundoscopic exam reveals sharp disc margins. Pupils equal, briskly reactive to light. Extraocular movements full without nystagmus. Visual fields full to confrontation. Hearing intact. Facial sensation intact. Face, tongue, palate moves normally and symmetrically.  Motor: Normal bulk and tone. Normal strength in all tested extremity muscles Sensory.: intact to touch , pinprick , position and vibratory sensation.  Coordination: Rapid alternating movements normal in all extremities. Finger-to-nose and heel-to-shin performed accurately bilaterally. Gait and Station: Arises from chair without difficulty.  Stance is normal. Gait demonstrates normal stride length and balance with no assistive device.  Reflexes: 1+ and symmetric.    NIHSS  0 Modified Rankin  0   ASSESSMENT: RONOLD HARDGROVE is a 48 y.o. year old male presenting with left sided weakness. Vascular risk factors include HTN, HLD, obesity and family history.     PLAN:  Stroke: right thalamic infarct likely secondary to small vessel disease source: Residual deficit: none. Continue aspirin 81 mg daily  and atorvastatin 40mg  daily for secondary stroke prevention. Discussed secondary stroke prevention measures and importance of close PCP follow up for aggressive stroke risk factor management. I have gone over the pathophysiology of stroke, warning signs and symptoms, risk factors and their management in some detail with instructions to go to the closest emergency room for symptoms of concern. HTN: BP goal <130/90. Stable on losartan. Readings normally 120s/70s. Continue per PCP HLD: LDL goal <70. Recent LDL 100. Continue atorvastatin 40mg  daily per PCP.  DMII: A1c goal<7.0. Recent A1c 4.9. Not diabetic. Continue healthy, well balanced diet.  Obesity: continue close follow up with PCP for weight management. Healthy, well balanced diet advised.   Elevated cardiolipin IgM: likely  reactionary, will repeat    Follow up as needed   CC:  [facility] provider: Dr. Pearlean Brownie PCP: Irven Coe, MD    I spent 45 minutes of face-to-face and non-face-to-face time with patient.  This included previsit chart review including review of recent hospitalization, lab review, study review, order entry, electronic health record documentation, patient education regarding recent stroke including etiology, secondary stroke prevention measures and importance of managing stroke risk factors, residual deficits and typical recovery time and answered all other questions to patient satisfaction   Shawnie Dapper, [facility]  [facility] [address] [place], [place] [address]  Phone [phone] Fax [phone] Note: This document was prepared with digital dictation and possible smart phrase technology. Any transcriptional errors that result from this process are unintentional.

## 2023-03-06 ENCOUNTER — Ambulatory Visit: Payer: 59 | Admitting: Family Medicine

## 2023-03-06 ENCOUNTER — Encounter: Payer: Self-pay | Admitting: Family Medicine

## 2023-03-06 VITALS — BP 136/74 | HR 79 | Ht 69.0 in | Wt 225.6 lb

## 2023-03-06 DIAGNOSIS — I63331 Cerebral infarction due to thrombosis of right posterior cerebral artery: Secondary | ICD-10-CM

## 2023-03-13 LAB — HOMOCYSTEINE: Homocysteine: 15.2 umol/L — ABNORMAL HIGH (ref 0.0–14.5)

## 2023-03-13 LAB — CARDIOLIPIN ANTIBODIES, IGM+IGG
Anticardiolipin IgG: <9 GPL U/mL (ref 0–14)
Anticardiolipin IgM: 18 [MPL'U]/mL — ABNORMAL HIGH (ref 0–12)

## 2023-03-14 ENCOUNTER — Encounter: Payer: Self-pay | Admitting: Family Medicine

## 2023-03-16 ENCOUNTER — Emergency Department (HOSPITAL_COMMUNITY)

## 2023-03-16 ENCOUNTER — Emergency Department (HOSPITAL_COMMUNITY)
Admission: EM | Admit: 2023-03-16 | Discharge: 2023-03-16 | Disposition: A | Discharge status: Home / Self Care | Attending: Emergency Medicine | Admitting: Emergency Medicine

## 2023-03-16 ENCOUNTER — Other Ambulatory Visit: Payer: Self-pay

## 2023-03-16 DIAGNOSIS — N132 Hydronephrosis with renal and ureteral calculous obstruction: Secondary | ICD-10-CM | POA: Insufficient documentation

## 2023-03-16 DIAGNOSIS — Z8673 Personal history of transient ischemic attack (TIA), and cerebral infarction without residual deficits: Secondary | ICD-10-CM | POA: Diagnosis not present

## 2023-03-16 DIAGNOSIS — R109 Unspecified abdominal pain: Secondary | ICD-10-CM | POA: Diagnosis present

## 2023-03-16 DIAGNOSIS — Z7982 Long term (current) use of aspirin: Secondary | ICD-10-CM | POA: Diagnosis not present

## 2023-03-16 DIAGNOSIS — N201 Calculus of ureter: Secondary | ICD-10-CM

## 2023-03-16 LAB — URINALYSIS, W/ REFLEX TO CULTURE (INFECTION SUSPECTED)
Bacteria, UA: NONE SEEN
RBC / HPF: >50 RBC/hpf (ref 0–5)

## 2023-03-16 LAB — CBC WITH DIFFERENTIAL/PLATELET
Abs Immature Granulocytes: 0.02 10*3/uL (ref 0.00–0.07)
Basophils Absolute: 0 10*3/uL (ref 0.0–0.1)
Basophils Relative: 1 %
Eosinophils Absolute: 0.3 10*3/uL (ref 0.0–0.5)
Eosinophils Relative: 5 %
HCT: 40 % (ref 39.0–52.0)
Hemoglobin: 14.1 g/dL (ref 13.0–17.0)
Immature Granulocytes: 0 %
Lymphocytes Relative: 16 %
Lymphs Abs: 1 10*3/uL (ref 0.7–4.0)
MCH: 31.7 pg (ref 26.0–34.0)
MCHC: 35.3 g/dL (ref 30.0–36.0)
MCV: 89.9 fL (ref 80.0–100.0)
Monocytes Absolute: 0.9 10*3/uL (ref 0.1–1.0)
Monocytes Relative: 14 %
Neutro Abs: 4 10*3/uL (ref 1.7–7.7)
Neutrophils Relative %: 64 %
Platelets: 267 10*3/uL (ref 150–400)
RBC: 4.45 MIL/uL (ref 4.22–5.81)
RDW: 12.2 % (ref 11.5–15.5)
WBC: 6.3 10*3/uL (ref 4.0–10.5)
nRBC: 0 % (ref 0.0–0.2)

## 2023-03-16 LAB — BASIC METABOLIC PANEL
Anion gap: 9 (ref 5–15)
BUN: 15 mg/dL (ref 6–20)
CO2: 24 mmol/L (ref 22–32)
Calcium: 8.9 mg/dL (ref 8.9–10.3)
Chloride: 104 mmol/L (ref 98–111)
Creatinine, Ser: 1.3 mg/dL — ABNORMAL HIGH (ref 0.61–1.24)
GFR, Estimated: >60 mL/min (ref >60)
Glucose, Bld: 108 mg/dL — ABNORMAL HIGH (ref 70–99)
Potassium: 4 mmol/L (ref 3.5–5.1)
Sodium: 137 mmol/L (ref 135–145)

## 2023-03-16 MED ORDER — ONDANSETRON 4 MG PO TBDP: 4.0000 mg | ORAL_TABLET | Freq: Three times a day (TID) | ORAL | 0 refills | Status: AC | PRN

## 2023-03-16 MED ORDER — KETOROLAC TROMETHAMINE 30 MG/ML IJ SOLN
15.0000 mg | Freq: Once | INTRAMUSCULAR | Status: AC
  Administered 2023-03-16: 15 mg via INTRAMUSCULAR
  Filled 2023-03-16: qty 1

## 2023-03-16 MED ORDER — HYDROCODONE-ACETAMINOPHEN 5-325 MG PO TABS: 1.0000 | ORAL_TABLET | Freq: Four times a day (QID) | ORAL | 0 refills | Status: AC | PRN

## 2023-03-16 MED ORDER — SODIUM CHLORIDE 0.9 % IV BOLUS
1000.0000 mL | Freq: Once | INTRAVENOUS | Status: AC
  Administered 2023-03-16: 1000 mL via INTRAVENOUS

## 2023-03-16 MED ORDER — TAMSULOSIN HCL 0.4 MG PO CAPS
0.4000 mg | ORAL_CAPSULE | Freq: Every day | ORAL | 0 refills | Status: AC
Start: 2023-03-16 — End: ?

## 2023-03-16 NOTE — ED Provider Triage Note (Signed)
 Emergency Medicine Provider Triage Evaluation Note  Wayne Edwards , a 48 y.o. male  was evaluated in triage.  Pt complains of abdominal pain.  Believes he is passing a kidney stone.  Symptoms began 5 days ago.  Has a history of kidney stones.  No fevers.  Some nausea with dry heaving, no vomiting.  Noticed some blood in the urine today.  Bilateral testicle pain as well.  Pain is worse in the right flank.  Review of Systems  Positive: As above Negative: As above  Physical Exam  BP (!) 174/103 (BP Location: Right Arm)   Pulse 75   Temp 98.6 F (37 C)   Resp 18   SpO2 100%  Gen:   Awake, no distress   Resp:  Normal effort  MSK:   Moves extremities without difficulty  Other:    Medical Decision Making  Medically screening exam initiated at 12:46 PM.  Appropriate orders placed.  Renee Rival was informed that the remainder of the evaluation will be completed by another provider, this initial triage assessment does not replace that evaluation, and the importance of remaining in the ED until their evaluation is complete.  Workup initiated   Michelle Piper, Cordelia Poche 03/16/23 1247

## 2023-03-16 NOTE — Discharge Instructions (Signed)
 You have been seen today for your complaint of flank pain. Your lab work was overall reassuring.  Your kidney function was a little bit elevated but we gave you fluids for this. Your imaging showed a 5 mm stone on the right side. Your discharge medications include Vicodin. This is an opioid pain medication. You should only take this medication as needed for severe pain. You should not drive, operate heavy machinery or make important decisions while taking this medication. You should use alternative methods for pain relief while taking this medication including stretching, gentle range of motion, and Tylenol.  Monitor how much Tylenol you take, you may only take up to 4000 mg total in 1 day.   Take Flomax once daily to help pass the stone easier Use Zofran as needed for nausea and vomiting Follow up with: Dr. Mena Goes with urology.  Call tomorrow Please seek immediate medical care if you develop any of the following symptoms: You have a fever or chills. You develop severe pain. You develop new abdominal pain. You faint. You are unable to urinate. At this time there does not appear to be the presence of an emergent medical condition, however there is always the potential for conditions to change. Please read and follow the below instructions.  Do not take your medicine if  develop an itchy rash, swelling in your mouth or lips, or difficulty breathing; call 911 and seek immediate emergency medical attention if this occurs.  You may review your lab tests and imaging results in their entirety on your MyChart account.  Please discuss all results of fully with your primary care provider and other specialist at your follow-up visit.  Note: Portions of this text may have been transcribed using voice recognition software. Every effort was made to ensure accuracy; however, inadvertent computerized transcription errors may still be present.

## 2023-03-16 NOTE — ED Triage Notes (Signed)
 Pt began having right flank pain on Sunday along with NV. Pain decreased until today when pain increased again and pt has hematuria and increased sharp right flank pain. Pt believes he has kidney stone as this feels like past Kidney stones

## 2023-03-16 NOTE — ED Provider Notes (Signed)
 [facility] Provider Note   CSN: [id number] Arrival date & time: 03/16/23  1236     History  Chief Complaint  Patient presents with   Flank Pain    Wayne Edwards is a 48 y.o. male.  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  He believes he is passing a kidney stone.  Symptoms began approximately 5 days ago.  Pain is bilateral but worse in the right flank.  He reports some nausea with dry heaving but no active vomiting.  He states he noticed some blood in his urine today which prompted him to come to the emergency department.  He reports bilateral testicle pain as well.  He has no concerns for STDs.  He denies any fevers or chills.   Flank Pain       Home Medications Prior to Admission medications   Medication Sig Start Date End Date Taking? Authorizing Provider  aspirin EC 81 MG tablet Take 1 tablet (81 mg total) by mouth daily. Swallow whole. 01/30/23  Yes Ghimire, Werner Lean, MD  atorvastatin (LIPITOR) 40 MG tablet Take 1 tablet (40 mg total) by mouth daily. 01/30/23  Yes Ghimire, Werner Lean, MD  HYDROcodone-acetaminophen (NORCO/VICODIN) 5-325 MG tablet Take 1 tablet by mouth every 6 (six) hours as needed. 03/16/23  Yes Roland Lipke, Edsel Petrin, PA-C  losartan (COZAAR) 50 MG tablet Take 1 tablet (50 mg total) by mouth daily. 01/29/23 01/29/24 Yes Ghimire, Werner Lean, MD  MELATONIN PO Take 1 tablet by mouth at bedtime as needed (sleep).   Yes [provider]  ondansetron (ZOFRAN-ODT) 4 MG disintegrating tablet Take 1 tablet (4 mg total) by mouth every 8 (eight) hours as needed for nausea or vomiting. 03/16/23  Yes Amyrie Illingworth, Edsel Petrin, PA-C  tamsulosin (FLOMAX) 0.4 MG CAPS capsule Take 1 capsule (0.4 mg total) by mouth daily. 03/16/23  Yes Eligio Angert, Edsel Petrin, PA-C      Allergies    Yellow jacket venom [bee venom], Beef-derived drug products, and Pork-derived products     Review of Systems   Review of Systems  Genitourinary:  Positive for flank pain and hematuria.  All other systems reviewed and are negative.   Physical Exam Updated Vital Signs BP 130/85   Pulse 85   Temp 98.2 F (36.8 C) (Oral)   Resp 19   Ht 5\' 9"  (1.753 m)   Wt 99.8 kg   SpO2 99%   BMI 32.49 kg/m  Physical Exam Vitals and nursing note reviewed.  Constitutional:      General: He is not in acute distress.    Appearance: Normal appearance. He is normal weight. He is not ill-appearing.     Comments: Resting comfortably in bed  HENT:     Head: Normocephalic and atraumatic.  Cardiovascular:     Rate and Rhythm: Normal rate and regular rhythm.  Pulmonary:     Effort: Pulmonary effort is normal. No respiratory distress.  Abdominal:     General: Abdomen is flat.  Musculoskeletal:        General: Normal range of motion.     Cervical back: Neck supple.  Skin:    General: Skin is warm and dry.  Neurological:     Mental Status: He is alert and oriented to person, place, and time.  Psychiatric:        Mood and Affect: Mood normal.  Behavior: Behavior normal.     ED Results / Procedures / Treatments   Labs (all labs ordered are listed, but only abnormal results are displayed) Labs Reviewed  BASIC METABOLIC PANEL - Abnormal; Notable for the following components:      Result Value   Glucose, Bld 108 (*)    Creatinine, Ser 1.30 (*)    All other components within normal limits  URINALYSIS, W/ REFLEX TO CULTURE (INFECTION SUSPECTED) - Abnormal; Notable for the following components:   Color, Urine RED (*)    APPearance TURBID (*)    Glucose, UA   (*)    Value: TEST NOT REPORTED DUE TO COLOR INTERFERENCE OF URINE PIGMENT   Hgb urine dipstick   (*)    Value: TEST NOT REPORTED DUE TO COLOR INTERFERENCE OF URINE PIGMENT   Bilirubin Urine   (*)    Value: TEST NOT REPORTED DUE TO COLOR INTERFERENCE OF URINE PIGMENT   Ketones, ur   (*)    Value: TEST NOT REPORTED DUE  TO COLOR INTERFERENCE OF URINE PIGMENT   Protein, ur   (*)    Value: TEST NOT REPORTED DUE TO COLOR INTERFERENCE OF URINE PIGMENT   Nitrite   (*)    Value: TEST NOT REPORTED DUE TO COLOR INTERFERENCE OF URINE PIGMENT   Leukocytes,Ua   (*)    Value: TEST NOT REPORTED DUE TO COLOR INTERFERENCE OF URINE PIGMENT   All other components within normal limits  CBC WITH DIFFERENTIAL/PLATELET    EKG None  Radiology CT Renal Stone Study Result Date: 03/16/2023 CLINICAL DATA:  Right flank pain for the past 5 days. EXAM: CT ABDOMEN AND PELVIS WITHOUT CONTRAST TECHNIQUE: Multidetector CT imaging of the abdomen and pelvis was performed following the standard protocol without IV contrast. RADIATION DOSE REDUCTION: This exam was performed according to the departmental dose-optimization program which includes automated exposure control, adjustment of the mA and/or kV according to patient size and/or use of iterative reconstruction technique. COMPARISON:  CT abdomen pelvis dated October 08, 2009. FINDINGS: Lower chest: No acute abnormality. Hepatobiliary: No focal liver abnormality is seen. No gallstones, gallbladder wall thickening, or biliary dilatation. Pancreas: Unremarkable. No pancreatic ductal dilatation or surrounding inflammatory changes. Spleen: Normal in size without focal abnormality. Adrenals/Urinary Tract: Adrenal glands and left kidney are unremarkable. 5 mm calculus in the mid to distal right ureter with resultant mild right hydroureteronephrosis. The bladder is unremarkable. Stomach/Bowel: Unchanged small hiatal hernia. The stomach is otherwise within normal limits. No bowel wall thickening, distention, or surrounding inflammatory changes. Mild sigmoid colonic diverticulosis. Normal appendix. Vascular/Lymphatic: No significant vascular findings are present. No enlarged abdominal or pelvic lymph nodes. Reproductive: Prostate is unremarkable. Other: No free fluid or pneumoperitoneum. Musculoskeletal: No  acute or significant osseous findings. IMPRESSION: 1. 5 mm calculus in the mid to distal right ureter with resultant mild right hydroureteronephrosis. Electronically Signed   By: Obie Dredge M.D.   On: 03/16/2023 15:58    Procedures Procedures    Medications Ordered in ED Medications  ketorolac (TORADOL) 30 MG/ML injection 15 mg (15 mg Intramuscular Given 03/16/23 1315)  sodium chloride 0.9 % bolus 1,000 mL (1,000 mLs Intravenous New Bag/Given 03/16/23 1639)    ED Course/ Medical Decision Making/ A&P                                 Medical Decision Making Amount and/or Complexity of Data Reviewed Labs: ordered. Radiology:  ordered.  Risk Prescription drug management.  This patient presents to the ED for concern of flank pain, this involves an extensive number of treatment options, and is a complaint that carries with it a high risk of complications and morbidity. The differential diagnosis of emergent flank pain includes, but is not limited to :Abdominal aortic aneurysm,, Renal artery embolism,Renal vein thrombosis, Aortic dissection, Mesenteric ischemia, Pyelonephritis, Renal infarction, Renal hemorrhage, Nephrolithiasis/ Renal Colic, Bladder tumor,Cystitis, Biliary colic, Pancreatitis Perforated peptic ulcer Appendicitis ,Inguinal Hernia, Diverticulitis, Bowel obstruction Testicular torsion,Epididymitis Shingles Lower lobe pneumonia, Retroperitoneal hematoma/abscess/tumor, Epidural abscess, Epidural hematoma   My initial workup includes labs, imaging, symptom control  Additional history obtained from: Nursing notes from this visit.  I ordered, reviewed and interpreted labs which include: CBC, BMP, urinalysis.  No leukocytosis or anemia.  No Electra derangement.  Creatinine at 1.30 with a baseline of approximately 1.1.  Urine without evidence of infection  I ordered imaging studies including CT stone study I independently visualized and interpreted imaging which showed 5 mm  stone at the right distal ureter with mild hydronephrosis I agree with the radiologist interpretation  Afebrile, hemodynamically stable.  48 year old male presenting to the ED for evaluation for lower abdominal pain, worse in the right flank.  History of kidney stones.  Feels like he has a kidney stone.  Some nausea and dry heaving but no vomiting.  No fevers.  Lab workup reassuring.  Slightly elevated creatinine treated with IV fluids in the emergency department.  Does not meet criteria for AKI.  Urine without evidence of infection.  CT stone study positive for stone on the right side.  Likely source of symptoms.  Reports significant improvement in his symptoms after treatment in the emergency department.  He has seen Dr. Mena Goes in the past for kidney stones.  He was encouraged to follow-up.  He was given prescriptions for pain medication, antiemetic and Flomax.  He was given return precautions.  Stable at discharge.  At this time there does not appear to be any evidence of an acute emergency medical condition and the patient appears stable for discharge with appropriate outpatient follow up. Diagnosis was discussed with patient who verbalizes understanding of care plan and is agreeable to discharge. I have discussed return precautions with patient who verbalizes understanding. Patient encouraged to follow-up with their PCP within 1 week. All questions answered.  Note: Portions of this report may have been transcribed using voice recognition software. Every effort was made to ensure accuracy; however, inadvertent computerized transcription errors may still be present.        Final Clinical Impression(s) / ED Diagnoses Final diagnoses:  Ureterolithiasis    Rx / DC Orders ED Discharge Orders          Ordered    ondansetron (ZOFRAN-ODT) 4 MG disintegrating tablet  Every 8 hours PRN        03/16/23 1700    tamsulosin (FLOMAX) 0.4 MG CAPS capsule  Daily        03/16/23 1700     HYDROcodone-acetaminophen (NORCO/VICODIN) 5-325 MG tablet  Every 6 hours PRN        03/16/23 1700              Michelle Piper, PA-C 03/16/23 1730    Glyn Ade, MD 03/17/23 [phone]

## 2023-03-28 ENCOUNTER — Other Ambulatory Visit: Payer: Self-pay | Admitting: Urology

## 2023-03-28 NOTE — Progress Notes (Signed)
 Left voicemail for patient [phone], informing him to arrive at main entrance of [facility] at 0645 on Friday with nothing to eat or drink after  midnight on Thursday, take laxative of choice Thursday afternoon, drink plenty to hydrate well, stop all aspirin and nonsteroidal anti   inflammatories starting now but may take other prescribed medications, bring driver's license and insurance card and wear comfortable clothing with no metal from waist down and no flip flops or sandals. Informed that we will attempt to call back either today or tomorrow to review health history.

## 2023-03-29 ENCOUNTER — Encounter (HOSPITAL_COMMUNITY): Payer: Self-pay | Admitting: Urology

## 2023-03-29 NOTE — Progress Notes (Signed)
 Spoke w/ via phone for pre-op interview--- Lab needs dos-KUB        Lab results------ COVID test --patient states asymptomatic no test needed Arrive at --0645 NPO after MN  Pre-Surgery Ensure or G2:  Med rec completed Medications to take morning of surgery - norco, zofran, lipitor, cozaar, flomax Diabetic medication -----  GLP1 agonist last dose: GLP1 instructions:  Patient instructed no nail polish to be worn day of surgery Patient instructed to bring photo id and insurance card day of surgery Patient aware to have Driver (ride ) / caregiver    for 24 hours after surgery - spouse- [facility] Patient Special Instructions ----- Pre-Op special Instructions --per Hayden Rasmussen  Patient verbalized understanding of instructions that were given at this phone interview. Patient denies chest pain, sob, fever, cough at the interview.

## 2023-03-29 NOTE — Progress Notes (Signed)
 Voicemail left for patient to bring blue folder on Friday, not to date, time or sign those papers, don't take any pepto bismol, no alcohol for at least 24 hrs prior to procedure and no tobacco for at least 6 hrs prior to procedure and suggested having a heating pad at home for after procedure. Also left phone number to call back today [phone] and phone [phone] to call day of procedure if having any issues or s/s of covid.

## 2023-03-30 NOTE — H&P (Signed)
 Office Visit Report     03/28/2023   --------------------------------------------------------------------------------   Wayne Edwards  MRN: [id number]  DOB: 09-07-1975, 48 year old Male  SSN: [id number]   PRIMARY CARE:  Irven Coe, MD  PRIMARY CARE FAX:  [phone]  REFERRING:  Jannifer Hick, MD  PROVIDER:  Jettie Pagan, M.D.  TREATING:  Anne Fu, NP  LOCATION:  [facility] [phone]     --------------------------------------------------------------------------------   CC/HPI: Wayne Edwards is a 48 year old male who is seen in consultation today for nephrolithiasis.   1. Nephrolithiasis:  -Pre 2025: R ESWL in 05/2012  Patient experienced right-sided flank pain. CT A/P 03/16/2023 revealed a 5 mm stone in the mid to distal right ureter with mild right hydroureteronephrosis. No other stones were identified.  Patient presents today in follow-up and denies any abdominal pain or flank pain. He denies fevers or chills. He states he is voiding without difficulty. He is unsure if he has passed a stone. He declines imaging today. If needed proceed with intervention, he prefers ESWL.   03/28/2023: Patient returns today for follow-up evaluation. He denies interval stone material passage. Continues to be symptomatic with waxing and waning symptoms of renal colic and associated urinary frequency/urgency. He seen some gross hematuria with mild burning with urination about every 2 to 3 days. He remains on tamsulosin, denies fevers or chills, nausea/vomiting. He states yesterday was quite bad for him but today is much better. KUB today shows continued presence of a right distal ureteral calculi.     ALLERGIES: No Allergies    MEDICATIONS: Aspirin 81 mg tablet,chewable  Tamsulosin Hcl 0.4 mg capsule  Atorvastatin Calcium 40 mg tablet  DiazePAM 10 MG Oral Tablet 0 Oral  Hydrocodone-Acetaminophen 5-325 MG Oral Tablet 6 Oral  Losartan Potassium 50 mg tablet  Melatonin   Multivitamin  Ondansetron Hcl 4 mg tablet     GU PSH: ESWL - 2014 Vasectomy       PSH Notes: Encounter for contraceptive planning, Lithotripsy, Dental Surgery   NON-GU PSH: Visit Complexity (formerly GPC1X) - 03/17/2023     GU PMH: Hydronephrosis - 03/17/2023 Ureteral calculus - 03/17/2023, Calculus of ureter, - 2014 Other microscopic hematuria, Microscopic hematuria - 2014 Renal calculus, Nephrolithiasis - 2014 Testicular pain, unspecified, Testicular pain - 2014    NON-GU PMH: Encounter for general adult medical examination without abnormal findings, Encounter for preventive health examination - 2014 Personal history of other diseases of the digestive system, History of esophageal reflux - 2014    FAMILY HISTORY: Family Health Status - Father alive at age 81 - Runs In Family Family Health Status - Mother's Age - Runs In Family Family Health Status Number - Runs In Family nephrolithiasis - Father   SOCIAL HISTORY: Marital Status: Married Current Smoking Status: Patient has never smoked.   Tobacco Use Assessment Completed: Used Tobacco in last 30 days? Drinks 4 drinks per week. Types of alcohol consumed: Beer. Light Drinker.      Notes: Never A Smoker, Caffeine Use, Marital History - Currently Married, Tobacco Use, Being A Social Drinker, Occupation:   REVIEW OF SYSTEMS:    GU Review Male:   Patient reports frequent urination. Patient denies hard to postpone urination, burning/ pain with urination, get up at night to urinate, leakage of urine, stream starts and stops, trouble starting your stream, have to strain to urinate , erection problems, and penile pain.  Gastrointestinal (Upper):   Patient denies nausea, vomiting, and indigestion/ heartburn.  Gastrointestinal (  Lower):   Patient denies diarrhea and constipation.  Constitutional:   Patient denies fever, night sweats, weight loss, and fatigue.  Skin:   Patient denies skin rash/ lesion and itching.  Eyes:   Patient denies  blurred vision and double vision.  Ears/ Nose/ Throat:   Patient denies sore throat and sinus problems.  Hematologic/Lymphatic:   Patient denies swollen glands and easy bruising.  Cardiovascular:   Patient denies leg swelling and chest pains.  Respiratory:   Patient denies cough and shortness of breath.  Endocrine:   Patient denies excessive thirst.  Musculoskeletal:   Patient reports back pain. Patient denies joint pain.  Neurological:   Patient denies headaches and dizziness.  Psychologic:   Patient denies depression and anxiety.   VITAL SIGNS:      03/28/2023 02:42 PM  Weight 218 lb / 98.88 kg  Height 69 in / 175.26 cm  BP 107/72 mmHg  Pulse 68 /min  Temperature 98.5 F / 36.9 C  BMI 32.2 kg/m   MULTI-SYSTEM PHYSICAL EXAMINATION:    Constitutional: Well-nourished. No physical deformities. Normally developed. Good grooming.  Neck: Neck symmetrical, not swollen. Normal tracheal position.  Respiratory: No labored breathing, no use of accessory muscles.   Cardiovascular: Normal temperature, normal extremity pulses, no swelling, no varicosities.  Skin: No paleness, no jaundice, no cyanosis. No lesion, no ulcer, no rash.  Neurologic / Psychiatric: Oriented to time, oriented to place, oriented to person. No depression, no anxiety, no agitation.  Gastrointestinal: No mass, no tenderness, no rigidity, non obese abdomen.  Musculoskeletal: Normal gait and station of head and neck.     Complexity of Data:  Source Of History:  Patient, Medical Record Summary  Records Review:   Previous Doctor Records, Previous Hospital Records, Previous Patient Records  Urine Test Review:   Urinalysis  X-Ray Review: KUB: Reviewed Films. Discussed With Patient.  C.T. Abdomen/Pelvis: Reviewed Films. Reviewed Report.     03/28/23  Urinalysis  Urine Appearance Clear   Urine Color Straw   Urine Glucose Neg mg/dL  Urine Bilirubin Neg mg/dL  Urine Ketones Neg mg/dL  Urine Specific Gravity <=1.005   Urine  Blood 3+ ery/uL  Urine pH <=5.0   Urine Protein Neg mg/dL  Urine Urobilinogen 0.2 mg/dL  Urine Nitrites Neg   Urine Leukocyte Esterase Neg leu/uL  Urine WBC/hpf NS (Not Seen)   Urine RBC/hpf 3 - 10/hpf   Urine Epithelial Cells NS (Not Seen)   Urine Bacteria NS (Not Seen)   Urine Mucous Not Present   Urine Yeast NS (Not Seen)   Urine Trichomonas Not Present   Urine Cystals NS (Not Seen)   Urine Casts NS (Not Seen)   Urine Sperm Not Present    PROCEDURES:         KUB - 16109  A single view of the abdomen is obtained. An approximately 4.6 x 8 mm opacity consistent with right ureteral calculi is easily seen on today's KUB study located at the anatomical expected location of the right UVJ.      Patient confirmed No Neulasta OnPro Device.           Urinalysis w/Scope Dipstick Dipstick Cont'd Micro  Color: Straw Bilirubin: Neg mg/dL WBC/hpf: NS (Not Seen)  Appearance: Clear Ketones: Neg mg/dL RBC/hpf: 3 - 60/AVW  Specific Gravity: <=1.005 Blood: 3+ ery/uL Bacteria: NS (Not Seen)  pH: <=5.0 Protein: Neg mg/dL Cystals: NS (Not Seen)  Glucose: Neg mg/dL Urobilinogen: 0.2 mg/dL Casts: NS (Not Seen)  Nitrites: Neg Trichomonas: Not Present    Leukocyte Esterase: Neg leu/uL Mucous: Not Present      Epithelial Cells: NS (Not Seen)      Yeast: NS (Not Seen)      Sperm: Not Present    ASSESSMENT:      ICD-10 Details  1 GU:   Ureteral calculus - N20.1 Right, Acute, Complicated Injury   PLAN:           Orders Labs Urine Culture          Schedule Return Visit/Planned Activity: Next Available Appointment - Schedule Surgery          Document Letter(s):  Created for Patient: Clinical Summary         Notes:   Right distal ureteral calculi easily seen on today's KUB study. He remains symptomatic. Now ready to proceed with definitive intervention, given stone visibility he is a great candidate for shockwave lithotripsy. He has had this in the past and did well with that. I will work  on expediting getting him set up for next available right shockwave lithotripsy. Continue tamsulosin and as needed pain medication until then. In the event he does pass the stone he will contact the office for further instructions and possible cancellation of the procedure. ED f/u instructions for acutely worsening/poorly controlled symptoms in the interval also discussed in detail.    *For shockwave lithotripsy I described the risks which include arrhythmia, kidney contusion, kidney hemorrhage, need for transfusion, long-term risk of diabetes or hypertension, back discomfort, flank ecchymosis, flank abrasion, inability to break up stone, inability to pass stone fragments, Steinstrasse, infection associated with obstructing stones, need for different surgical procedure and possible need for repeat shockwave lithotripsy.    * Signed by Anne Fu, NP on 03/28/23 at 3:12 PM (EDT)*      The information contained in this medical record document is considered private and confidential patient information. This information can only be used for the medical diagnosis and/or medical services that are being provided by the patient's selected caregivers. This information can only be distributed outside of the patient's care if the patient agrees and signs waivers of authorization for this information to be sent to an outside source or route.

## 2023-03-31 ENCOUNTER — Encounter (HOSPITAL_COMMUNITY): Payer: Self-pay | Admitting: Urology

## 2023-03-31 ENCOUNTER — Ambulatory Visit (HOSPITAL_COMMUNITY)

## 2023-03-31 ENCOUNTER — Other Ambulatory Visit: Payer: Self-pay

## 2023-03-31 ENCOUNTER — Ambulatory Visit (HOSPITAL_COMMUNITY)
Admission: RE | Admit: 2023-03-31 | Discharge: 2023-03-31 | Disposition: A | Discharge status: Home / Self Care | Attending: Urology | Admitting: Urology

## 2023-03-31 ENCOUNTER — Encounter (HOSPITAL_COMMUNITY)
Admission: RE | Disposition: A | Discharge status: Home / Self Care | Payer: Self-pay | Source: Home / Self Care | Attending: Urology

## 2023-03-31 DIAGNOSIS — N200 Calculus of kidney: Secondary | ICD-10-CM

## 2023-03-31 DIAGNOSIS — N201 Calculus of ureter: Secondary | ICD-10-CM | POA: Diagnosis present

## 2023-03-31 HISTORY — DX: Essential (primary) hypertension: I10

## 2023-03-31 HISTORY — DX: Transient cerebral ischemic attack, unspecified: G45.9

## 2023-03-31 HISTORY — PX: EXTRACORPOREAL SHOCK WAVE LITHOTRIPSY: SHX1557

## 2023-03-31 SURGERY — LITHOTRIPSY, ESWL
Anesthesia: LOCAL | Laterality: Right

## 2023-03-31 MED ORDER — DIPHENHYDRAMINE HCL 25 MG PO CAPS
25.0000 mg | ORAL_CAPSULE | ORAL | Status: AC
  Administered 2023-03-31: 25 mg via ORAL
  Filled 2023-03-31: qty 1

## 2023-03-31 MED ORDER — SODIUM CHLORIDE 0.9 % IV SOLN: INTRAVENOUS | Status: DC

## 2023-03-31 MED ORDER — DIAZEPAM 5 MG PO TABS
10.0000 mg | ORAL_TABLET | ORAL | Status: AC
  Administered 2023-03-31: 10 mg via ORAL
  Filled 2023-03-31: qty 2

## 2023-03-31 MED ORDER — CIPROFLOXACIN HCL 500 MG PO TABS
500.0000 mg | ORAL_TABLET | ORAL | Status: AC
  Administered 2023-03-31: 500 mg via ORAL
  Filled 2023-03-31: qty 1

## 2023-03-31 NOTE — Interval H&P Note (Signed)
 History and Physical Interval Note:  03/31/2023 10:00 AM  Wayne Edwards  has presented today for surgery, with the diagnosis of RIGHT URETERAL CALCULI.  The various methods of treatment have been discussed with the patient and family. After consideration of risks, benefits and other options for treatment, the patient has consented to  Procedure(s): LITHOTRIPSY, ESWL (Right) as a surgical intervention.  The patient's history has been reviewed, patient examined, no change in status, stable for surgery.  I have reviewed the patient's chart and labs. Pt without dysuria or fever. No cough or congestion. No stone passage. Stone remains right distal on KUB. Questions were answered to the patient's satisfaction.     Jerilee Field

## 2023-03-31 NOTE — Op Note (Signed)
 Right ESWL   6 mm Right distal stone  Findings: Stone localized well. Faded well. He may need a staged procedure if he fails to pass the stone or stone fragments.

## 2023-03-31 NOTE — Discharge Instructions (Signed)

## 2023-04-03 ENCOUNTER — Encounter (HOSPITAL_COMMUNITY): Payer: Self-pay | Admitting: Urology

## 2023-06-27 ENCOUNTER — Encounter: Payer: Self-pay | Admitting: Podiatry

## 2023-06-27 ENCOUNTER — Ambulatory Visit: Admitting: Podiatry

## 2023-06-27 ENCOUNTER — Ambulatory Visit (INDEPENDENT_AMBULATORY_CARE_PROVIDER_SITE_OTHER)

## 2023-06-27 DIAGNOSIS — M7672 Peroneal tendinitis, left leg: Secondary | ICD-10-CM

## 2023-06-27 DIAGNOSIS — M79672 Pain in left foot: Secondary | ICD-10-CM

## 2023-06-27 MED ORDER — MELOXICAM 15 MG PO TABS
15.0000 mg | ORAL_TABLET | Freq: Every day | ORAL | 0 refills | Status: AC
Start: 2023-06-27 — End: ?

## 2023-06-27 NOTE — Patient Instructions (Signed)
 Peroneal Tendinopathy Rehab Ask your health care provider which exercises are safe for you. Do exercises exactly as told by your health care provider and adjust them as directed. It is normal to feel mild stretching, pulling, tightness, or discomfort as you do these exercises. Stop right away if you feel sudden pain or your pain gets worse. Do not begin these exercises until told by your health care provider. Stretching and range-of-motion exercises These exercises warm up your muscles and joints. They can help improve the movement and flexibility of your ankle. They may also help to relieve pain and stiffness. Gastrocnemius and soleus stretch, standing This is an exercise in which you stand on a step and use your body weight to stretch your calf muscles. To do this exercise: Stand on the edge of a step on the ball of your left / right foot. The ball of your foot is on the walking surface, right under your toes. Keep your other foot firmly on the same step. Hold on to the wall, a railing, or a chair for balance. Slowly lift your other foot, allowing your body weight to press your left / right heel down over the edge of the step. You should feel a stretch in your left / right calf (gastrocnemius and soleus). Hold this position for __________ seconds. Return both feet to the step. Repeat this exercise with a slight bend in your left / right knee. Repeat __________ times with your left / right knee straight and __________ times with your left / right knee bent. Complete this exercise __________ times a day. Strengthening exercises These exercises build strength and endurance in your foot and ankle. Endurance is the ability to use your muscles for a long time, even after they get tired. Ankle dorsiflexion with band  Secure a rubber exercise band or tube to an object, such as a table leg, that will not move when the band is pulled. Secure the other end of the band around your left / right foot. Sit on  the floor. Face the object with your left / right leg extended. The band or tube should be slightly tense when your foot is relaxed. Slowly flex your left / right ankle and toes to bring your foot toward you (dorsiflexion). Hold this position for __________ seconds. Let the band or tube slowly pull your foot back to the starting position. Repeat __________ times. Complete this exercise __________ times a day. Ankle eversion  Sit on the floor with your legs straight out in front of you. Loop a rubber exercise band or tube around the ball of your left / right foot. The ball of your foot is on the walking surface, right under your toes. Hold the ends of the band in your hands. You can also secure the band to a stable object. The band or tube should be slightly tense when your foot is relaxed. Slowly push your foot outward, away from your other leg (eversion). Hold this position for __________ seconds. Slowly return your foot to the starting position. Repeat __________ times. Complete this exercise __________ times a day. Plantar flexion, standing This exercise is sometimes called a standing heel raise. Stand with your feet shoulder-width apart. Place your hands on a wall or table to steady yourself as needed. Try not to use it for support. Keep your weight spread evenly over the width of your feet while you slowly rise up on your toes (plantar flexion). If told by your health care provider: Shift your weight  toward your left / right leg until you feel challenged. Stand on your left / right leg only. Hold this position for __________ seconds. Repeat __________ times. Complete this exercise __________ times a day. Single leg stand  Without shoes, stand near a railing or in a doorway. You may hold on to the railing or doorframe as needed. Stand on your left / right foot. Keep your big toe down on the floor and try to keep your arch lifted. Do not roll to the outside of your foot. If this  exercise is too easy, you can try it with your eyes closed or while standing on a pillow. Hold this position for __________ seconds. Repeat __________ times. Complete this exercise __________ times a day. This information is not intended to replace advice given to you by your health care provider. Make sure you discuss any questions you have with your health care provider. Document Revised: 04/22/2021 Document Reviewed: 04/22/2021 Elsevier Patient Education  2024 [employer].

## 2023-06-27 NOTE — Progress Notes (Signed)
  Subjective:  Patient ID: Wayne Edwards, male    DOB: [birth year],   MRN: [id number]  Chief Complaint  Patient presents with   Foot Pain    Patient states he has been having foot pain in his left foot lateral side, it has been going on for a month now and it is a very sharp constant pain. Patient is not taking any medication for pain     48 y.o. male presents for concern as above. He has tried some stretching and massage but no relief. Hurts when walking.  . Denies any other pedal complaints. Denies n/v/f/c.   Past Medical History:  Diagnosis Date   Hypertension    Mini stroke    this year (2025)    Objective:  Physical Exam: Vascular: DP/PT pulses 2/4 bilateral. CFT <3 seconds. Normal hair growth on digits. No edema.  Skin. No lacerations or abrasions bilateral feet.  Musculoskeletal: MMT 5/5 bilateral lower extremities in DF, PF, Inversion and Eversion. Deceased ROM in DF of ankle joint. Tender around insertion of peroneal tendon on left foot. Pain with DF PF and eversion noted. No pain with inversion. No pain tracking proximally along the tendon. No pain around ankle or medial foot.  Neurological: Sensation intact to light touch.   Assessment:   1. Peroneal tendonitis, left      Plan:  Patient was evaluated and treated and all questions answered. X-rays reviewed and discussed with patient. No acute fractures or dislocations.  Discussed peroneal tendinitis and treatment options at length with patient Discussed stretching exercises and provided handout. Prescription for meloxicam provided. Previous Creatinin was up a bit. GFR wnl. Will only do meloxicam for one month.  Dispensed Tri-Lock ankle brace. Discussed that if the symptoms do not improve can consider PT/MRI. Patient to return in 6 to 8 weeks or sooner if symptoms fail to improve or worsen.   Jennefer Moats, DPM
